# Patient Record
Sex: Female | Born: 1985
Health system: Southern US, Community
[De-identification: ages and names within clinical notes are randomized; demographics above are authoritative.]

## PROBLEM LIST (undated history)

## (undated) DIAGNOSIS — E119 Type 2 diabetes mellitus without complications: Secondary | ICD-10-CM

## (undated) DIAGNOSIS — F445 Conversion disorder with seizures or convulsions: Secondary | ICD-10-CM

## (undated) DIAGNOSIS — F419 Anxiety disorder, unspecified: Secondary | ICD-10-CM

## (undated) DIAGNOSIS — F4310 Post-traumatic stress disorder, unspecified: Secondary | ICD-10-CM

## (undated) DIAGNOSIS — O24419 Gestational diabetes mellitus in pregnancy, unspecified control: Secondary | ICD-10-CM

## (undated) DIAGNOSIS — N920 Excessive and frequent menstruation with regular cycle: Secondary | ICD-10-CM

## (undated) HISTORY — DX: Excessive and frequent menstruation with regular cycle: N92.0

## (undated) HISTORY — DX: Anxiety disorder, unspecified: F41.9

## (undated) HISTORY — DX: Conversion disorder with seizures or convulsions: F44.5

## (undated) HISTORY — DX: Type 2 diabetes mellitus without complications: E11.9

## (undated) HISTORY — DX: Post-traumatic stress disorder, unspecified: F43.10

---

## 2007-12-01 ENCOUNTER — Emergency Department (HOSPITAL_COMMUNITY): Admission: EM | Admit: 2007-12-01 | Discharge: 2007-12-01 | Payer: Self-pay | Admitting: Emergency Medicine

## 2007-12-02 ENCOUNTER — Emergency Department (HOSPITAL_COMMUNITY): Admission: EM | Admit: 2007-12-02 | Discharge: 2007-12-02 | Payer: Self-pay | Admitting: Internal Medicine

## 2008-02-07 ENCOUNTER — Emergency Department (HOSPITAL_COMMUNITY): Admission: EM | Admit: 2008-02-07 | Discharge: 2008-02-07 | Payer: Self-pay | Admitting: Emergency Medicine

## 2010-02-05 ENCOUNTER — Emergency Department (HOSPITAL_COMMUNITY)
Admission: EM | Admit: 2010-02-05 | Discharge: 2010-02-05 | Payer: Self-pay | Source: Home / Self Care | Admitting: Emergency Medicine

## 2010-02-08 LAB — URINE MICROSCOPIC-ADD ON

## 2010-02-08 LAB — URINALYSIS, ROUTINE W REFLEX MICROSCOPIC
Bilirubin Urine: NEGATIVE
Hgb urine dipstick: NEGATIVE
Ketones, ur: NEGATIVE mg/dL
Leukocytes, UA: NEGATIVE
Nitrite: POSITIVE — AB
Protein, ur: NEGATIVE mg/dL
Specific Gravity, Urine: 1.018 (ref 1.005–1.030)
Urine Glucose, Fasting: NEGATIVE mg/dL
Urobilinogen, UA: 1 mg/dL (ref 0.0–1.0)
pH: 6.5 (ref 5.0–8.0)

## 2010-02-08 LAB — POCT PREGNANCY, URINE: Preg Test, Ur: NEGATIVE

## 2010-10-26 LAB — COMPREHENSIVE METABOLIC PANEL
ALT: 16
AST: 20
Albumin: 3.9
Alkaline Phosphatase: 80
BUN: 7
CO2: 24
Calcium: 9.4
Chloride: 107
Creatinine, Ser: 0.55
GFR calc Af Amer: >60
GFR calc non Af Amer: >60
Glucose, Bld: 85
Potassium: 3.4 — ABNORMAL LOW
Sodium: 138
Total Bilirubin: 0.6
Total Protein: 6.9

## 2010-10-26 LAB — DIFFERENTIAL
Basophils Absolute: 0
Basophils Absolute: 0
Basophils Relative: 0
Basophils Relative: 0
Eosinophils Absolute: 0.1
Eosinophils Absolute: 0.1
Eosinophils Relative: 1
Eosinophils Relative: 1
Lymphocytes Relative: 17
Lymphocytes Relative: 21
Lymphs Abs: 1.5
Lymphs Abs: 2.3
Monocytes Absolute: 0.7
Monocytes Absolute: 0.7
Monocytes Relative: 7
Monocytes Relative: 8
Neutro Abs: 6.1
Neutro Abs: 7.8 — ABNORMAL HIGH
Neutrophils Relative %: 71
Neutrophils Relative %: 73

## 2010-10-26 LAB — CBC
HCT: 41.5
HCT: 41.6
Hemoglobin: 13.6
Hemoglobin: 13.8
MCHC: 32.8
MCHC: 33.1
MCV: 93.7
MCV: 94.1
Platelets: 278
Platelets: 280
RBC: 4.41
RBC: 4.44
RDW: 12.1
RDW: 12.1
WBC: 11 — ABNORMAL HIGH
WBC: 8.4

## 2010-10-26 LAB — URINALYSIS, ROUTINE W REFLEX MICROSCOPIC
Bilirubin Urine: NEGATIVE
Glucose, UA: NEGATIVE
Ketones, ur: NEGATIVE
Leukocytes, UA: NEGATIVE
Nitrite: NEGATIVE
Protein, ur: NEGATIVE
Specific Gravity, Urine: 1.02
Urobilinogen, UA: 1
pH: 6

## 2010-10-26 LAB — URINE MICROSCOPIC-ADD ON

## 2010-10-26 LAB — GC/CHLAMYDIA PROBE AMP, GENITAL
Chlamydia, DNA Probe: NEGATIVE
GC Probe Amp, Genital: NEGATIVE

## 2010-10-26 LAB — POCT PREGNANCY, URINE: Preg Test, Ur: NEGATIVE

## 2010-10-26 LAB — LIPASE, BLOOD: Lipase: 12

## 2011-01-26 ENCOUNTER — Ambulatory Visit (INDEPENDENT_AMBULATORY_CARE_PROVIDER_SITE_OTHER): Payer: BC Managed Care – PPO

## 2011-01-26 DIAGNOSIS — R1084 Generalized abdominal pain: Secondary | ICD-10-CM

## 2011-01-26 DIAGNOSIS — J019 Acute sinusitis, unspecified: Secondary | ICD-10-CM

## 2011-01-26 DIAGNOSIS — R351 Nocturia: Secondary | ICD-10-CM

## 2011-01-26 DIAGNOSIS — N390 Urinary tract infection, site not specified: Secondary | ICD-10-CM

## 2011-03-03 ENCOUNTER — Ambulatory Visit (INDEPENDENT_AMBULATORY_CARE_PROVIDER_SITE_OTHER): Payer: BC Managed Care – PPO | Admitting: Family Medicine

## 2011-03-03 DIAGNOSIS — Z658 Other specified problems related to psychosocial circumstances: Secondary | ICD-10-CM

## 2011-03-03 DIAGNOSIS — R079 Chest pain, unspecified: Secondary | ICD-10-CM

## 2011-03-03 DIAGNOSIS — F419 Anxiety disorder, unspecified: Secondary | ICD-10-CM

## 2011-03-03 LAB — POCT CBC
Granulocyte percent: 65.7 %G (ref 37–80)
HCT, POC: 41.9 % (ref 37.7–47.9)
Hemoglobin: 13.5 g/dL (ref 12.2–16.2)
Lymph, poc: 3.5 — AB (ref 0.6–3.4)
MCH, POC: 30.5 pg (ref 27–31.2)
MCHC: 32.2 g/dL (ref 31.8–35.4)
MCV: 94.7 fL (ref 80–97)
MID (cbc): 0.7 (ref 0–0.9)
MPV: 9.1 fL (ref 0–99.8)
POC Granulocyte: 8.1 — AB (ref 2–6.9)
POC LYMPH PERCENT: 28.3 %L (ref 10–50)
POC MID %: 6 %M (ref 0–12)
Platelet Count, POC: 365 10*3/uL (ref 142–424)
RBC: 4.42 M/uL (ref 4.04–5.48)
RDW, POC: 13.1 %
WBC: 12.4 10*3/uL — AB (ref 4.6–10.2)

## 2011-03-03 MED ORDER — CITALOPRAM HYDROBROMIDE 10 MG PO TABS: 10.0000 mg | ORAL_TABLET | Freq: Every day | ORAL | Status: DC

## 2011-03-03 MED ORDER — ALPRAZOLAM 0.25 MG PO TABS: 0.2500 mg | ORAL_TABLET | Freq: Every evening | ORAL | Status: AC | PRN

## 2011-03-03 NOTE — Progress Notes (Signed)
  Subjective:    Patient ID: Barbera Setters, female    DOB: Dec 25, 1985, 26 y.o.   MRN: [id number]  HPI Pt presents c/o CP that developed 3 hours ago.  Pain sharp and pounding. Worse with deep inspiration. Symptoms come and go.  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  Respiratory: Positive for shortness of breath.   Cardiovascular: Positive for palpitations.  Gastrointestinal: Negative for nausea and vomiting.  Neurological: Positive for light-headedness.      See PH9 questionnaire, No SI/HI Objective:   Physical Exam  Constitutional: She appears well-developed and well-nourished.  HENT:  Head: Normocephalic and atraumatic.  Neck: Neck supple. Thyromegaly: full, nontender, no palpable nodules.  Cardiovascular: Normal rate, regular rhythm and normal heart sounds.   Pulmonary/Chest: Effort normal and breath sounds normal.  Abdominal: Soft. Bowel sounds are normal.  Skin: Skin is warm and dry.   Tearful   Pulse oximetry- 99%, pulse-90 EKG NSR, rate 94     Assessment & Plan:   1. Chest pain  POCT CBC, TSH, EKG 12-Lead  2. Anxiety  citalopram (CELEXA) 10 MG tablet  3. Psychosocial stressors      Encouraged patient to seek supportive psychotherapy and names of counselors provided.  At the very end of our OV patient inquires into extended leave off of work. I have given patient off until Monday but if extended leave is needed I believe it's more appropriate for patient to obtain psychiatric consultation. Patient in agreement with this plan.   I will see her back in 2 weeks or patient to schedule apt with Dr. Evelene Croon.

## 2011-03-04 ENCOUNTER — Encounter: Payer: Self-pay | Admitting: Family Medicine

## 2011-03-04 DIAGNOSIS — F419 Anxiety disorder, unspecified: Secondary | ICD-10-CM | POA: Insufficient documentation

## 2011-03-04 LAB — TSH: TSH: 1.069 u[IU]/mL (ref 0.350–4.500)

## 2011-03-10 NOTE — Progress Notes (Signed)
Quick Note:  Please call patient with normal TSH. CBC discussed at OV. Thanks! KR  ______

## 2011-08-05 ENCOUNTER — Ambulatory Visit: Payer: BC Managed Care – PPO | Admitting: Family Medicine

## 2011-08-05 VITALS — BP 122/78 | HR 78 | Temp 98.7°F | Resp 17 | Ht 62.0 in | Wt 141.0 lb

## 2011-08-05 DIAGNOSIS — R252 Cramp and spasm: Secondary | ICD-10-CM

## 2011-08-05 DIAGNOSIS — N390 Urinary tract infection, site not specified: Secondary | ICD-10-CM

## 2011-08-05 LAB — POCT URINALYSIS DIPSTICK
Bilirubin, UA: NEGATIVE
Blood, UA: NEGATIVE
Glucose, UA: NEGATIVE
Ketones, UA: NEGATIVE
Nitrite, UA: POSITIVE
Protein, UA: NEGATIVE
Spec Grav, UA: 1.025
Urobilinogen, UA: 0.2
pH, UA: 6

## 2011-08-05 LAB — POCT UA - MICROSCOPIC ONLY
Casts, Ur, LPF, POC: NEGATIVE
Crystals, Ur, HPF, POC: NEGATIVE
Mucus, UA: NEGATIVE
Yeast, UA: NEGATIVE

## 2011-08-05 MED ORDER — MAGNESIUM OXIDE 400 MG PO TABS: 400.0000 mg | ORAL_TABLET | Freq: Every day | ORAL | Status: DC

## 2011-08-05 MED ORDER — NITROFURANTOIN MACROCRYSTAL 100 MG PO CAPS: 100.0000 mg | ORAL_CAPSULE | Freq: Two times a day (BID) | ORAL | Status: AC

## 2011-08-05 NOTE — Progress Notes (Signed)
@[email or web address] @   Patient ID: Jacqueline Bailey MRN: [id number], DOB: 06-Aug-1985, 26 y.o. Date of Encounter: 08/05/2011, 6:36 PM  Primary Physician: No primary provider on file.  Chief Complaint:  Chief Complaint  Patient presents with  . Urinary Tract Infection  . Back Pain  . Hand Pain    HPI: 26 y.o. year old female presents with 3 day history of dysuria, urgency, and frequency. Last UTI was January No hematuria LMP: June 19th No sick contacts, recent antibiotics, or recent travels.   No vaginal discharge, back pain, fever Recently had Ob-Gyn visit  No past medical history on file.   Home Meds: Prior to Admission medications   Medication Sig Start Date End Date Taking? Authorizing Provider  citalopram (CELEXA) 10 MG tablet Take 1 tablet (10 mg total) by mouth daily. 03/03/11 03/02/12  Dois Davenport, MD    Allergies: No Known Allergies  History   Social History  . Marital Status: Single    Spouse Name: N/A    Number of Children: N/A  . Years of Education: N/A   Occupational History  . Not on file.   Social History Main Topics  . Smoking status: Never Smoker   . Smokeless tobacco: Not on file  . Alcohol Use: Not on file  . Drug Use: Not on file  . Sexually Active: Not on file   Other Topics Concern  . Not on file   Social History Narrative  . No narrative on file     Review of Systems: Constitutional: negative for chills, fever, night sweats or weight changes Cardiovascular: negative for chest pain or palpitations Respiratory: negative for hemoptysis, wheezing, or shortness of breath Abdominal: negative for abdominal pain, nausea, vomiting or diarrhea Dermatological: negative for rash Neurologic: negative for headache   Physical Exam: Blood pressure 122/78, pulse 78, temperature 98.7 F (37.1 C), temperature source Oral, resp. rate 17, height 5\' 2"  (1.575 m), weight 141 lb (63.957 kg), last menstrual period 07/12/2011, SpO2 100.00%., Body mass index is  25.79 kg/(m^2). General: Well developed, well nourished, in no acute distress. Head: Normocephalic, atraumatic, eyes without discharge, sclera non-icteric, nares are congested. Bilateral auditory canals clear, TM's are without perforation, pearly grey with reflective cone of light bilaterally. Serous effusion bilaterally behind TM's. Maxillary sinus TTP. Oral cavity moist, dentition normal. Posterior pharynx with post nasal drip and mild erythema. No peritonsillar abscess or tonsillar exudate. Neck: Supple. No thyromegaly. Full ROM. No lymphadenopathy. Lungs: Coarse breath sounds bilaterally without Clear bilaterally to auscultation without wheezes, rales, or rhonchi. Breathing is unlabored.  Heart: RRR with S1 S2. No murmurs, rubs, or gallops appreciated. Abdomen: Soft, non-tender, non-distended with normoactive bowel sounds. No hepatosplenomegaly. No rebound/guarding. No obvious abdominal masses. McBurney's, Rovsing's, Iliopsoas, and table jar all negative. Msk:  Strength and tone normal for age. Extremities: No clubbing or cyanosis. No edema. Neuro: Alert and oriented X 3. Moves all extremities spontaneously. CNII-XII grossly in tact. Psych:  Responds to questions appropriately with a normal affect.   Labs: Results for orders placed in visit on 08/05/11  POCT URINALYSIS DIPSTICK      Component Value Range   Color, UA yellow     Clarity, UA turbid     Glucose, UA neg     Bilirubin, UA neg     Ketones, UA neg     Spec Grav, UA 1.025     Blood, UA neg     pH, UA 6.0     Protein, UA neg  Urobilinogen, UA 0.2     Nitrite, UA pos     Leukocytes, UA small (1+)    POCT UA - MICROSCOPIC ONLY      Component Value Range   WBC, Ur, HPF, POC 14-15     RBC, urine, microscopic 0-1     Bacteria, U Microscopic 3+ bacili     Mucus, UA neg     Epithelial cells, urine per micros 4-10     Crystals, Ur, HPF, POC neg     Casts, Ur, LPF, POC neg     Yeast, UA neg        ASSESSMENT AND PLAN:    26 y.o. year old female with UTI - -Mucinex -Tylenol/Motrin prn -Rest/fluids -RTC precautions -RTC 3-5 days if no improvement  Signed, Elvina Sidle, MD 08/05/2011 6:36 PM

## 2011-08-18 LAB — OB RESULTS CONSOLE ANTIBODY SCREEN: Antibody Screen: NEGATIVE

## 2011-08-18 LAB — OB RESULTS CONSOLE RUBELLA ANTIBODY, IGM: Rubella: IMMUNE

## 2011-08-18 LAB — OB RESULTS CONSOLE ABO/RH

## 2011-09-27 ENCOUNTER — Ambulatory Visit: Payer: BC Managed Care – PPO | Admitting: Family Medicine

## 2011-09-27 VITALS — BP 118/58 | HR 86 | Temp 98.5°F | Resp 16 | Ht 63.0 in | Wt 140.0 lb

## 2011-09-27 DIAGNOSIS — R112 Nausea with vomiting, unspecified: Secondary | ICD-10-CM

## 2011-09-27 DIAGNOSIS — Z3490 Encounter for supervision of normal pregnancy, unspecified, unspecified trimester: Secondary | ICD-10-CM

## 2011-09-27 LAB — POCT CBC
Granulocyte percent: 74.5 %G (ref 37–80)
HCT, POC: 44.7 % (ref 37.7–47.9)
Hemoglobin: 13.8 g/dL (ref 12.2–16.2)
Lymph, poc: 2.6 (ref 0.6–3.4)
MCH, POC: 30.1 pg (ref 27–31.2)
MCHC: 30.9 g/dL — AB (ref 31.8–35.4)
MCV: 97.7 fL — AB (ref 80–97)
MID (cbc): 1 — AB (ref 0–0.9)
MPV: 8.9 fL (ref 0–99.8)
POC Granulocyte: 10.3 — AB (ref 2–6.9)
POC LYMPH PERCENT: 18.6 %L (ref 10–50)
POC MID %: 6.9 %M (ref 0–12)
Platelet Count, POC: 339 10*3/uL (ref 142–424)
RBC: 4.58 M/uL (ref 4.04–5.48)
RDW, POC: 12.9 %
WBC: 13.8 10*3/uL — AB (ref 4.6–10.2)

## 2011-09-27 LAB — POCT UA - MICROSCOPIC ONLY
Casts, Ur, LPF, POC: NEGATIVE
Crystals, Ur, HPF, POC: NEGATIVE
Mucus, UA: POSITIVE
Yeast, UA: NEGATIVE

## 2011-09-27 LAB — POCT URINALYSIS DIPSTICK
Blood, UA: NEGATIVE
Glucose, UA: NEGATIVE
Ketones, UA: 15
Leukocytes, UA: NEGATIVE
Nitrite, UA: NEGATIVE
Protein, UA: 30
Spec Grav, UA: >=1.03
Urobilinogen, UA: 2
pH, UA: 6

## 2011-09-27 LAB — POCT URINE PREGNANCY: Preg Test, Ur: POSITIVE

## 2011-09-27 MED ORDER — ONDANSETRON HCL 4 MG PO TABS
4.0000 mg | ORAL_TABLET | Freq: Once | ORAL | Status: AC
  Administered 2011-09-27: 4 mg via ORAL

## 2011-09-27 MED ORDER — ONDANSETRON 8 MG PO TBDP: 8.0000 mg | ORAL_TABLET | Freq: Three times a day (TID) | ORAL | Status: AC | PRN

## 2011-09-27 NOTE — Patient Instructions (Addendum)
Make appt with OB gyn

## 2011-09-27 NOTE — Progress Notes (Signed)
26 yo with nausea x 1 week, and today started throwing up.  The nausea seems to occur every morning and then returns midday.  She also has migratory upper abdominal pain.  She noticed some blood in emesis this morning.  She works at a call center  Objective:  NAD Chest clear Heart regular without murmur Abdomen:  Soft, nontender without mass or HSM Results for orders placed in visit on 09/27/11  POCT UA - MICROSCOPIC ONLY      Component Value Range   WBC, Ur, HPF, POC 3-5     RBC, urine, microscopic 0-1     Bacteria, U Microscopic 1+     Mucus, UA positive     Epithelial cells, urine per micros 6-8     Crystals, Ur, HPF, POC neg     Casts, Ur, LPF, POC neg     Yeast, UA neg    POCT URINE PREGNANCY      Component Value Range   Preg Test, Ur Positive    POCT URINALYSIS DIPSTICK      Component Value Range   Color, UA Dk yellow     Clarity, UA sl cloudy     Glucose, UA neg     Bilirubin, UA small     Ketones, UA 15     Spec Grav, UA >=1.030     Blood, UA neg     pH, UA 6.0     Protein, UA 30     Urobilinogen, UA 2.0     Nitrite, UA neg     Leukocytes, UA Negative    POCT CBC      Component Value Range   WBC 13.8 (*) 4.6 - 10.2 K/uL   Lymph, poc 2.6  0.6 - 3.4   POC LYMPH PERCENT 18.6  10 - 50 %L   MID (cbc) 1.0 (*) 0 - 0.9   POC MID % 6.9  0 - 12 %M   POC Granulocyte 10.3 (*) 2 - 6.9   Granulocyte percent 74.5  37 - 80 %G   RBC 4.58  4.04 - 5.48 M/uL   Hemoglobin 13.8  12.2 - 16.2 g/dL   HCT, POC 16.1  09.6 - 47.9 %   MCV 97.7 (*) 80 - 97 fL   MCH, POC 30.1  27 - 31.2 pg   MCHC 30.9 (*) 31.8 - 35.4 g/dL   RDW, POC 04.5     Platelet Count, POC 339  142 - 424 K/uL   MPV 8.9  0 - 99.8 fL   Assessment:  Pregnant.  Plan:  Refer to OB Off today Zofran tid

## 2011-10-24 ENCOUNTER — Encounter: Payer: Self-pay | Admitting: Obstetrics and Gynecology

## 2011-12-01 LAB — OB RESULTS CONSOLE GC/CHLAMYDIA: Gonorrhea: NEGATIVE

## 2012-02-18 ENCOUNTER — Emergency Department (HOSPITAL_COMMUNITY)
Admission: EM | Admit: 2012-02-18 | Discharge: 2012-02-18 | Disposition: A | Discharge status: Home / Self Care | Payer: BC Managed Care – PPO | Attending: Emergency Medicine | Admitting: Emergency Medicine

## 2012-02-18 ENCOUNTER — Encounter (HOSPITAL_COMMUNITY): Payer: Self-pay | Admitting: Emergency Medicine

## 2012-02-18 DIAGNOSIS — Z79899 Other long term (current) drug therapy: Secondary | ICD-10-CM | POA: Insufficient documentation

## 2012-02-18 DIAGNOSIS — K047 Periapical abscess without sinus: Secondary | ICD-10-CM

## 2012-02-18 DIAGNOSIS — K089 Disorder of teeth and supporting structures, unspecified: Secondary | ICD-10-CM | POA: Insufficient documentation

## 2012-02-18 DIAGNOSIS — K044 Acute apical periodontitis of pulpal origin: Secondary | ICD-10-CM | POA: Insufficient documentation

## 2012-02-18 DIAGNOSIS — O9989 Other specified diseases and conditions complicating pregnancy, childbirth and the puerperium: Secondary | ICD-10-CM | POA: Insufficient documentation

## 2012-02-18 MED ORDER — CEPHALEXIN 500 MG PO CAPS: 500.0000 mg | ORAL_CAPSULE | Freq: Four times a day (QID) | ORAL | Status: DC

## 2012-02-18 NOTE — ED Provider Notes (Signed)
History     CSN: [id number]  Arrival date & time 02/18/12  0436   First MD Initiated Contact with Patient 02/18/12 [phone]      Chief Complaint  Patient presents with  . Dental Pain    (Consider location/radiation/quality/duration/timing/severity/associated sxs/prior treatment) HPI History per patient. Right upper molar pain x2 days. Is [redacted] weeks pregnant and taking Tylenol at home with minimal relief. No fevers. No trouble swallowing. No trouble breathing. He is sharp in quality and not radiating. Hurts to chew. No known alleviating factors. Does not have a dentist.   History reviewed. No pertinent past medical history.  History reviewed. No pertinent past surgical history.  No family history on file.  History  Substance Use Topics  . Smoking status: Never Smoker   . Smokeless tobacco: Not on file  . Alcohol Use: No    OB History    Grav Para Term Preterm Abortions TAB SAB Ect Mult Living                  Review of Systems  Constitutional: Negative for fever and chills.  HENT: Positive for dental problem. Negative for drooling, mouth sores, neck pain and neck stiffness.   Eyes: Negative for pain.  Respiratory: Negative for shortness of breath.   Cardiovascular: Negative for chest pain.  Gastrointestinal: Negative for abdominal pain.  Genitourinary: Negative for dysuria.  Musculoskeletal: Negative for back pain.  Skin: Negative for rash.  Neurological: Negative for headaches.  All other systems reviewed and are negative.    Allergies  Review of patient's allergies indicates no known allergies.  Home Medications   Current Outpatient Rx  Name  Route  Sig  Dispense  Refill  . CITALOPRAM HYDROBROMIDE 10 MG PO TABS   Oral   Take 1 tablet (10 mg total) by mouth daily.   30 tablet   1   . MAGNESIUM OXIDE 400 MG PO TABS   Oral   Take 1 tablet (400 mg total) by mouth daily.   30 tablet   6     BP 126/78  Pulse 103  Temp 97.9 F (36.6 C) (Oral)  SpO2  100%  Physical Exam  Constitutional: She is oriented to person, place, and time. She appears well-developed and well-nourished.  HENT:  Head: Normocephalic and atraumatic.  Mouth/Throat: No oropharyngeal exudate.       Right upper posterior molar caries with tenderness. No gingival swelling or fluctuance. No trismus. No facial erythema or swelling.  Eyes: EOM are normal. Pupils are equal, round, and reactive to light.  Neck: Neck supple.  Cardiovascular: Regular rhythm and intact distal pulses.   Pulmonary/Chest: Effort normal. No respiratory distress.  Abdominal:       Gravid consistent with dates  Musculoskeletal: Normal range of motion. She exhibits no edema.  Neurological: She is alert and oriented to person, place, and time.  Skin: Skin is warm and dry.    ED Course  Dental Date/Time: 02/18/2012 5:31 AM Performed by: Sunnie Nielsen Authorized by: Sunnie Nielsen Consent: Verbal consent obtained. Risks and benefits: risks, benefits and alternatives were discussed Consent given by: patient Patient understanding: patient states understanding of the procedure being performed Patient consent: the patient's understanding of the procedure matches consent given Procedure consent: procedure consent matches procedure scheduled Required items: required blood products, implants, devices, and special equipment available Patient identity confirmed: verbally with patient Time out: Immediately prior to procedure a "time out" was called to verify the correct patient, procedure, equipment, support  staff and site/side marked as required. Preparation: Patient was prepped and draped in the usual sterile fashion. Local anesthesia used: yes Local anesthetic: bupivacaine 0.5% without epinephrine Anesthetic total: 1.8 ml Patient tolerance: Patient tolerated the procedure well with no immediate complications. Comments: 27-gauge needle used to locally inject Marcaine, adequate anesthesia achieved    (including critical care time)   MDM   Acute dental pain possible infection treated with dental block as above. Plan Tylenol antibiotics and followup with dentist. Vital signs and nursing notes reviewed.        Sunnie Nielsen, MD 02/18/12 [phone]

## 2012-02-18 NOTE — ED Notes (Signed)
PT. REPORTS PERSISTENT RIGHT UPPER MOLAR PAIN FOR 2 DAYS UNRELIEVED BY OTC TYLENOL , PT. STATES [redacted] WEEKS PREGNANT.

## 2012-03-21 ENCOUNTER — Encounter: Payer: BC Managed Care – PPO | Attending: Obstetrics | Admitting: *Deleted

## 2012-03-21 VITALS — Ht 63.0 in | Wt 155.4 lb

## 2012-03-21 DIAGNOSIS — O99810 Abnormal glucose complicating pregnancy: Secondary | ICD-10-CM | POA: Insufficient documentation

## 2012-03-21 DIAGNOSIS — Z713 Dietary counseling and surveillance: Secondary | ICD-10-CM | POA: Insufficient documentation

## 2012-03-22 ENCOUNTER — Encounter: Payer: Self-pay | Admitting: *Deleted

## 2012-03-22 NOTE — Progress Notes (Signed)
  Patient was seen on 03/21/12 for Gestational Diabetes self-management class at the [facility]. The following learning objectives were met by the patient during this course:   States the definition of Gestational Diabetes  States why dietary management is important in controlling blood glucose  Describes the effects each nutrient has on blood glucose levels  Demonstrates ability to create a balanced meal plan  Demonstrates carbohydrate counting   States when to check blood glucose levels  Demonstrates proper blood glucose monitoring techniques  States the effect of stress and exercise on blood glucose levels  States the importance of limiting caffeine and abstaining from alcohol and smoking  Blood glucose monitor given:  One Touch Ultra Mini Self Monitoring Kit Lot # [id number] X Exp: 04/2013 Blood glucose reading: 78 mg/dl  Patient instructed to monitor glucose levels: FBS: 60 - <90 2 hour: <120  *Patient received handouts:  Nutrition Diabetes and Pregnancy  Carbohydrate Counting List  Patient will be seen for follow-up as needed.

## 2012-03-22 NOTE — Patient Instructions (Signed)
Goals:  Check glucose levels per MD as instructed  Follow Gestational Diabetes Diet as instructed  Call for follow-up as needed    

## 2012-04-13 LAB — OB RESULTS CONSOLE GBS: GBS: POSITIVE

## 2012-05-14 ENCOUNTER — Inpatient Hospital Stay (HOSPITAL_COMMUNITY)
Admission: RE | Admit: 2012-05-14 | Discharge: 2012-05-18 | DRG: 650 | Disposition: A | Discharge status: Home / Self Care | Payer: BC Managed Care – PPO | Source: Ambulatory Visit | Attending: Obstetrics & Gynecology | Admitting: Obstetrics & Gynecology

## 2012-05-14 ENCOUNTER — Encounter (HOSPITAL_COMMUNITY): Payer: Self-pay

## 2012-05-14 ENCOUNTER — Other Ambulatory Visit: Payer: Self-pay | Admitting: Obstetrics

## 2012-05-14 DIAGNOSIS — Z22330 Carrier of Group B streptococcus: Secondary | ICD-10-CM

## 2012-05-14 DIAGNOSIS — O99814 Abnormal glucose complicating childbirth: Principal | ICD-10-CM | POA: Diagnosis present

## 2012-05-14 DIAGNOSIS — O9903 Anemia complicating the puerperium: Secondary | ICD-10-CM | POA: Diagnosis not present

## 2012-05-14 DIAGNOSIS — O139 Gestational [pregnancy-induced] hypertension without significant proteinuria, unspecified trimester: Secondary | ICD-10-CM | POA: Diagnosis present

## 2012-05-14 DIAGNOSIS — D62 Acute posthemorrhagic anemia: Secondary | ICD-10-CM | POA: Diagnosis not present

## 2012-05-14 DIAGNOSIS — O99892 Other specified diseases and conditions complicating childbirth: Secondary | ICD-10-CM | POA: Diagnosis present

## 2012-05-14 HISTORY — DX: Gestational diabetes mellitus in pregnancy, unspecified control: O24.419

## 2012-05-14 LAB — CBC
Hemoglobin: 12 g/dL (ref 12.0–15.0)
MCHC: 34.3 g/dL (ref 30.0–36.0)
RDW: 14.4 % (ref 11.5–15.5)
WBC: 13.7 10*3/uL — ABNORMAL HIGH (ref 4.0–10.5)

## 2012-05-14 LAB — TYPE AND SCREEN
ABO/RH(D): O POS
Antibody Screen: NEGATIVE

## 2012-05-14 LAB — GLUCOSE, CAPILLARY: Glucose-Capillary: 78 mg/dL (ref 70–99)

## 2012-05-14 MED ORDER — ACETAMINOPHEN 325 MG PO TABS
650.0000 mg | ORAL_TABLET | ORAL | Status: DC | PRN
  Administered 2012-05-15: 650 mg via ORAL
  Filled 2012-05-14: qty 2

## 2012-05-14 MED ORDER — BUTORPHANOL TARTRATE 1 MG/ML IJ SOLN
1.0000 mg | INTRAMUSCULAR | Status: DC | PRN
  Administered 2012-05-15 (×3): 1 mg via INTRAVENOUS
  Filled 2012-05-14 (×3): qty 1

## 2012-05-14 MED ORDER — OXYCODONE-ACETAMINOPHEN 5-325 MG PO TABS: 1.0000 | ORAL_TABLET | ORAL | Status: DC | PRN

## 2012-05-14 MED ORDER — OXYTOCIN 40 UNITS IN LACTATED RINGERS INFUSION - SIMPLE MED
1.0000 m[IU]/min | INTRAVENOUS | Status: DC
  Administered 2012-05-15: 2 m[IU]/min via INTRAVENOUS
  Filled 2012-05-14: qty 1000

## 2012-05-14 MED ORDER — LACTATED RINGERS IV SOLN
INTRAVENOUS | Status: DC
  Administered 2012-05-14 – 2012-05-16 (×8): via INTRAVENOUS

## 2012-05-14 MED ORDER — CITRIC ACID-SODIUM CITRATE 334-500 MG/5ML PO SOLN
30.0000 mL | ORAL | Status: DC | PRN
  Administered 2012-05-16: 30 mL via ORAL
  Filled 2012-05-14: qty 15

## 2012-05-14 MED ORDER — ZOLPIDEM TARTRATE 5 MG PO TABS
5.0000 mg | ORAL_TABLET | Freq: Every evening | ORAL | Status: DC | PRN
  Administered 2012-05-14: 5 mg via ORAL
  Filled 2012-05-14: qty 1

## 2012-05-14 MED ORDER — IBUPROFEN 600 MG PO TABS: 600.0000 mg | ORAL_TABLET | Freq: Four times a day (QID) | ORAL | Status: DC | PRN

## 2012-05-14 MED ORDER — TERBUTALINE SULFATE 1 MG/ML IJ SOLN: 0.2500 mg | Freq: Once | INTRAMUSCULAR | Status: AC | PRN

## 2012-05-14 MED ORDER — PENICILLIN G POTASSIUM 5000000 UNITS IJ SOLR
5.0000 10*6.[IU] | Freq: Once | INTRAVENOUS | Status: DC
  Filled 2012-05-14: qty 5

## 2012-05-14 MED ORDER — MISOPROSTOL 25 MCG QUARTER TABLET
25.0000 ug | ORAL_TABLET | ORAL | Status: DC | PRN
  Administered 2012-05-14 – 2012-05-15 (×2): 25 ug via VAGINAL
  Filled 2012-05-14 (×2): qty 0.25

## 2012-05-14 MED ORDER — OXYTOCIN BOLUS FROM INFUSION: 500.0000 mL | INTRAVENOUS | Status: DC

## 2012-05-14 MED ORDER — ONDANSETRON HCL 4 MG/2ML IJ SOLN
4.0000 mg | Freq: Four times a day (QID) | INTRAMUSCULAR | Status: DC | PRN
  Administered 2012-05-15 – 2012-05-16 (×5): 4 mg via INTRAVENOUS
  Filled 2012-05-14 (×5): qty 2

## 2012-05-14 MED ORDER — LACTATED RINGERS IV SOLN
500.0000 mL | INTRAVENOUS | Status: DC | PRN
  Administered 2012-05-14: 500 mL via INTRAVENOUS

## 2012-05-14 MED ORDER — OXYTOCIN 40 UNITS IN LACTATED RINGERS INFUSION - SIMPLE MED
62.5000 mL/h | INTRAVENOUS | Status: DC
  Filled 2012-05-14: qty 1000

## 2012-05-14 MED ORDER — LIDOCAINE HCL (PF) 1 % IJ SOLN: 30.0000 mL | INTRAMUSCULAR | Status: DC | PRN

## 2012-05-14 MED ORDER — PENICILLIN G POTASSIUM 5000000 UNITS IJ SOLR
2.5000 10*6.[IU] | INTRAVENOUS | Status: DC
  Filled 2012-05-14 (×5): qty 2.5

## 2012-05-14 NOTE — H&P (Signed)
Jacqueline Bailey is a 27 y.o. G1P0 at [redacted]w[redacted]d presenting for IOL. Pt notes onset contractions 2am today. Pt has been seen several times in the office and in [facility]U over the past week for concerns of labor and/or AROM. Seen again today for r/o labor. Pt notes good FM, no LOF today but presence of consistent uterine contractions. Continues to take routine BS checks, highest fasting today at 96.   PNCare at [facility] since 9 wks - GDM. DS at 170, failed 3 hr. Since then has done well with diet, only 17# total wt gain, nl growth by u/s, nl BS by qid testing and no need for meds. Plan IOL at 40 wks, ripening given unripe cvx. Pt understands risks of c/s w/ IOL and risks of GDM, shoulder dystocia, need for freq BS checks in labor - prolonged latent labor. Plan IOL, pt aware risks - GBS pos. PCN in labor - unable to swallow pills - stress related dizziness, tachycardia, orthostasis and pre-syncope during preg- improved on limited work hours and home bedrest. No cardiac w/u done. - fetal growth, last 3/27: 60%   Prenatal Transfer Tool  Maternal Diabetes: Yes:  Diabetes Type:  Diet controlled Genetic Screening: Normal Maternal Ultrasounds/Referrals: Normal Fetal Ultrasounds or other Referrals:  None Maternal Substance Abuse:  No Significant Maternal Medications:  None Significant Maternal Lab Results: None     OB History   Grav Para Term Preterm Abortions TAB SAB Ect Mult Living   1              Past Medical History  Diagnosis Date  . Diabetes mellitus without complication   . Gestational diabetes     diet controlled   History reviewed. No pertinent past surgical history. Family History: family history is not on file. Social History:  reports that she quit smoking about a year ago. She has never used smokeless tobacco. She reports that she does not drink alcohol or use illicit drugs.  Review of Systems - Negative except freq contractions   Dilation: Fingertip Effacement (%):  Thick Station: -3 Exam by:: l.poore, rn Blood pressure 133/88, pulse 111, temperature 97.1 F (36.2 C), temperature source Oral, resp. rate 20, height 5\' 2"  (1.575 m), weight 73.483 kg (162 lb), last menstrual period 08/12/2011.  Physical Exam: done in office 05/14/12 Gen: well appearing, no distress CV: RRR Pulm: CTAB Back: no CVAT Abd: gravid, NT, no RUQ pain LE: no edema, equal bilaterally, non-tender  NST 4/21: 11 pm Toco: rare/ irritibility FH: baseline 150's, accelerations present, no deceleratons, 10 beat variability  Prenatal labs: ABO, Rh: --/--/O POS (04/21 1940) Antibody: NEG (04/21 1940) Rubella:  immune RPR: Nonreactive (07/25 0000)  HBsAg: Negative (07/25 0000)  HIV: Non-reactive (07/25 0000)  GBS: Positive (03/21 0000)  1 hr Glucola 170   Genetic screening nl quad Anatomy US nl anatomy   Assessment/Plan: 27 y.o. G1P0 at [redacted]w[redacted]d - GDM. Plan IOL at 40 wks, ripening given unripe cvx. Pt understands risks of c/s w/ IOL and risks of GDM, shoulder dystocia, need for freq BS checks in labor - prolonged latent labor. Plan IOL, pt aware risks - GBS pos. PCN in labor - unable to swallow pills   Quintus Premo A. 05/14/2012, 11:00 PM

## 2012-05-15 ENCOUNTER — Inpatient Hospital Stay (HOSPITAL_COMMUNITY): Admission: AD | Admit: 2012-05-15 | Payer: Self-pay | Source: Ambulatory Visit | Admitting: Obstetrics

## 2012-05-15 ENCOUNTER — Encounter (HOSPITAL_COMMUNITY): Payer: Self-pay

## 2012-05-15 LAB — GLUCOSE, CAPILLARY
Glucose-Capillary: 118 mg/dL — ABNORMAL HIGH (ref 70–99)
Glucose-Capillary: 80 mg/dL (ref 70–99)
Glucose-Capillary: 81 mg/dL (ref 70–99)
Glucose-Capillary: 94 mg/dL (ref 70–99)
Glucose-Capillary: 97 mg/dL (ref 70–99)

## 2012-05-15 LAB — RPR: RPR Ser Ql: NONREACTIVE

## 2012-05-15 MED ORDER — EPHEDRINE 5 MG/ML INJ: 10.0000 mg | INTRAVENOUS | Status: DC | PRN

## 2012-05-15 MED ORDER — PHENYLEPHRINE 40 MCG/ML (10ML) SYRINGE FOR IV PUSH (FOR BLOOD PRESSURE SUPPORT): 80.0000 ug | PREFILLED_SYRINGE | INTRAVENOUS | Status: DC | PRN

## 2012-05-15 MED ORDER — PENICILLIN G POTASSIUM 5000000 UNITS IJ SOLR
5.0000 10*6.[IU] | Freq: Once | INTRAVENOUS | Status: AC
  Administered 2012-05-15: 5 10*6.[IU] via INTRAVENOUS
  Filled 2012-05-15: qty 5

## 2012-05-15 MED ORDER — PHENYLEPHRINE 40 MCG/ML (10ML) SYRINGE FOR IV PUSH (FOR BLOOD PRESSURE SUPPORT)
80.0000 ug | PREFILLED_SYRINGE | INTRAVENOUS | Status: AC | PRN
  Administered 2012-05-16 (×4): 80 ug via INTRAVENOUS
  Filled 2012-05-15: qty 5

## 2012-05-15 MED ORDER — FAMOTIDINE 20 MG PO TABS
20.0000 mg | ORAL_TABLET | Freq: Once | ORAL | Status: AC
  Administered 2012-05-15: 20 mg via ORAL
  Filled 2012-05-15: qty 1

## 2012-05-15 MED ORDER — NALBUPHINE SYRINGE 5 MG/0.5 ML
INJECTION | INTRAMUSCULAR | Status: AC
  Filled 2012-05-15: qty 1

## 2012-05-15 MED ORDER — OXYTOCIN 40 UNITS IN LACTATED RINGERS INFUSION - SIMPLE MED
1.0000 m[IU]/min | INTRAVENOUS | Status: DC
  Administered 2012-05-16: 14 m[IU]/min via INTRAVENOUS
  Administered 2012-05-16: 22 m[IU]/min via INTRAVENOUS
  Administered 2012-05-16: 36 m[IU]/min via INTRAVENOUS

## 2012-05-15 MED ORDER — PENICILLIN G POTASSIUM 5000000 UNITS IJ SOLR
2.5000 10*6.[IU] | INTRAVENOUS | Status: DC
  Administered 2012-05-15 – 2012-05-16 (×7): 2.5 10*6.[IU] via INTRAVENOUS
  Filled 2012-05-15 (×11): qty 2.5

## 2012-05-15 MED ORDER — NALBUPHINE SYRINGE 5 MG/0.5 ML
10.0000 mg | INJECTION | Freq: Once | INTRAMUSCULAR | Status: AC
  Administered 2012-05-15: 10 mg via INTRAVENOUS
  Filled 2012-05-15: qty 0.5

## 2012-05-15 MED ORDER — DIPHENHYDRAMINE HCL 50 MG/ML IJ SOLN: 12.5000 mg | INTRAMUSCULAR | Status: DC | PRN

## 2012-05-15 MED ORDER — LIDOCAINE HCL (PF) 1 % IJ SOLN
INTRAMUSCULAR | Status: DC | PRN
  Administered 2012-05-15 (×4): 4 mL

## 2012-05-15 MED ORDER — LACTATED RINGERS IV SOLN
500.0000 mL | Freq: Once | INTRAVENOUS | Status: AC
  Administered 2012-05-15: 500 mL via INTRAVENOUS

## 2012-05-15 MED ORDER — FENTANYL 2.5 MCG/ML BUPIVACAINE 1/10 % EPIDURAL INFUSION (WH - ANES)
14.0000 mL/h | INTRAMUSCULAR | Status: DC | PRN
  Administered 2012-05-15 – 2012-05-16 (×4): 14 mL/h via EPIDURAL
  Filled 2012-05-15 (×4): qty 125

## 2012-05-15 MED ORDER — EPHEDRINE 5 MG/ML INJ
10.0000 mg | INTRAVENOUS | Status: DC | PRN
  Filled 2012-05-15: qty 4

## 2012-05-15 NOTE — Anesthesia Preprocedure Evaluation (Signed)
Anesthesia Evaluation  Patient identified by MRN, date of birth, ID band Patient awake    Reviewed: Allergy & Precautions, H&P , NPO status , Patient's Chart, lab work & pertinent test results, reviewed documented beta blocker date and time   History of Anesthesia Complications Negative for: history of anesthetic complications  Airway Mallampati: II TM Distance: >3 FB Neck ROM: full    Dental  (+) Teeth Intact   Pulmonary former smoker (quit 1 year ago),  breath sounds clear to auscultation        Cardiovascular negative cardio ROS  Rhythm:regular Rate:Normal     Neuro/Psych negative neurological ROS  negative psych ROS   GI/Hepatic negative GI ROS, Neg liver ROS,   Endo/Other  diabetes (diet-controlled), Gestational  Renal/GU negative Renal ROS     Musculoskeletal   Abdominal   Peds  Hematology negative hematology ROS (+)   Anesthesia Other Findings   Reproductive/Obstetrics (+) Pregnancy                           Anesthesia Physical Anesthesia Plan  ASA: II  Anesthesia Plan: Epidural   Post-op Pain Management:    Induction:   Airway Management Planned:   Additional Equipment:   Intra-op Plan:   Post-operative Plan:   Informed Consent: I have reviewed the patients History and Physical, chart, labs and discussed the procedure including the risks, benefits and alternatives for the proposed anesthesia with the patient or authorized representative who has indicated his/her understanding and acceptance.     Plan Discussed with:   Anesthesia Plan Comments:         Anesthesia Quick Evaluation

## 2012-05-15 NOTE — Progress Notes (Signed)
S: Doing well, no complaints, pain well controlled with epidural  O: BP 125/78  Pulse 95  Temp(Src) 98.5 F (36.9 C) (Oral)  Resp 20  Ht 5\' 2"  (1.575 m)  Wt 73.483 kg (162 lb)  BMI 29.62 kg/m2  SpO2 100%  LMP 08/12/2011   FHT:  FHR: 140s bpm, variability: moderate,  accelerations:  Present,  decelerations:  Absent UC:   irregular, every 2-7 minutes SVE:   Dilation: 4 Effacement (%): 90 Station: -2;-3 Exam by:: dr Ernestina Penna   A / P:  26 y.o.  Obstetric History   G1   P0   T0   P0   A0   TAB0   SAB0   E0   M0   L0    at 40'w0d Protracted latent phase. Cont to titrate pitocin to effective contractions  Fetal Wellbeing:  Category I Pain Control:  Epidural  Anticipated MOD:  NSVD.  D/w pt and family continued patience as pt is just now starting to enter active labor.  Jacqueline Bailey A. 05/15/2012 9:23 PM

## 2012-05-15 NOTE — Anesthesia Procedure Notes (Signed)
Epidural Patient location during procedure: OB Start time: 05/15/2012 12:07 PM  Staffing Performed by: anesthesiologist   Preanesthetic Checklist Completed: patient identified, site marked, surgical consent, pre-op evaluation, timeout performed, IV checked, risks and benefits discussed and monitors and equipment checked  Epidural Patient position: sitting Prep: site prepped and draped and DuraPrep Patient monitoring: continuous pulse ox and blood pressure Approach: midline Injection technique: LOR air  Needle:  Needle type: Tuohy  Needle gauge: 17 G Needle length: 9 cm and 9 Needle insertion depth: 4 cm Catheter type: closed end flexible Catheter size: 19 Gauge Catheter at skin depth: 9 cm Test dose: negative  Assessment Events: blood not aspirated, injection not painful, no injection resistance, negative IV test and no paresthesia  Additional Notes Discussed risk of headache, infection, bleeding, nerve injury and failed or incomplete block.  Patient voices understanding and wishes to proceed.  Epidural placed easily on first attempt.  No paresthesia.  Patient tolerated procedure well with no apparent complications.  Jasmine December, MDReason for block:procedure for pain

## 2012-05-15 NOTE — Progress Notes (Signed)
S: Doing well, no complaints, pain well controlled with epidural  O: BP 130/80  Pulse 92  Temp(Src) 98.4 F (36.9 C) (Oral)  Resp 18  Ht 5\' 2"  (1.575 m)  Wt 73.483 kg (162 lb)  BMI 29.62 kg/m2  SpO2 100%  LMP 08/12/2011   FHT:  Reactive, 10 beat accels, no decels UC:   irregular, every 4-7 minutes, pitocinon SVE:   Dilation: 2 Effacement (%): 80 Station: -3 Exam by:: Dr. Ernestina Penna AROM clear, IUPC placed to help titrate pitocin   A / P:  26 y.o.  Obstetric History   G1   P0   T0   P0   A0   TAB0   SAB0   E0   M0   L0    at 40'0  Induction of labor due to gestational diabetes,  progressing well on pitocin  Fetal Wellbeing:  Category I Pain Control:  Epidural  Anticipated MOD:  NSVD Cont to increase pitocin  Jacqueline Bailey A. 05/15/2012, 3:37 PM

## 2012-05-15 NOTE — Progress Notes (Signed)
S: Doing well, no complaints, pain well controlled with epidural  O: BP 111/80  Pulse 88  Temp(Src) 98.4 F (36.9 C) (Oral)  Resp 18  Ht 5\' 2"  (1.575 m)  Wt 73.483 kg (162 lb)  BMI 29.62 kg/m2  SpO2 100%  LMP 08/12/2011   FHT: reactive w/o decels, 10 beat variability UC:   irregular, every 4-6 minutes, pit at 6 munits/ min SVE:   Dilation: 1 Effacement (%): 80 Station: -3 Exam by:: Dr. Ernestina Penna   A / P:  26 y.o.  Obstetric History   G1   P0   T0   P0   A0   TAB0   SAB0   E0   M0   L0    at [redacted]w[redacted]d  Induction of labor due to gestational diabetes,  progressing well on pitocin Slow progress, will plan AROM when head better applied to cvx  Fetal Wellbeing:  Category I Pain Control:  Epidural  Anticipated MOD:  NSVD, need to get pt into active labor  Jacqueline Buys A. 05/15/2012, 2:05 PM

## 2012-05-15 NOTE — Progress Notes (Signed)
Jacqueline Bailey is a 27 y.o. G1P0 at [redacted]w[redacted]d, IOL for GDM.   Objective: BP 132/82  Pulse 89  Temp(Src) 98.2 F (36.8 C) (Oral)  Resp 18  Ht 5\' 2"  (1.575 m)  Wt 162 lb (73.483 kg)  BMI 29.62 kg/m2  SpO2 100%  LMP 08/12/2011    FHT:  FHR: 135 bpm, variability: moderate,  accelerations:  Present,  decelerations:  Absent UC:   irregular, every 2-5 minutes on piticin SVE:   Dilation: 2 Effacement (%): 50 Station: -2 Exam by:: Erline Hau RNC  Labs: Lab Results  Component Value Date   WBC 13.7* 05/14/2012   HGB 12.0 05/14/2012   HCT 35.0* 05/14/2012   MCV 86.4 05/14/2012   PLT 251 05/14/2012    Assessment / Plan: 27 yo, G1 at 39.4 wks, GDM (A1), gest.HTN, well controlled.  Tried Stadol (several doses) and Nubain without much relief. Dysfunction contraction pattern, station high and head not well applied to cervix. Early for epidural but will proceed due to pt discomfort and failed medical options.  FHT category I Mode of delivery guarded for SVD, but continue to follow progress.   [name] R 05/15/2012, 11:57 AM

## 2012-05-16 ENCOUNTER — Encounter (HOSPITAL_COMMUNITY): Payer: Self-pay | Admitting: Anesthesiology

## 2012-05-16 ENCOUNTER — Inpatient Hospital Stay (HOSPITAL_COMMUNITY): Payer: BC Managed Care – PPO | Admitting: Anesthesiology

## 2012-05-16 ENCOUNTER — Encounter (HOSPITAL_COMMUNITY)
Admission: RE | Disposition: A | Discharge status: Home / Self Care | Payer: Self-pay | Source: Ambulatory Visit | Attending: Obstetrics & Gynecology

## 2012-05-16 ENCOUNTER — Encounter (HOSPITAL_COMMUNITY): Payer: Self-pay

## 2012-05-16 LAB — GLUCOSE, CAPILLARY: Glucose-Capillary: 102 mg/dL — ABNORMAL HIGH (ref 70–99)

## 2012-05-16 SURGERY — Surgical Case
Anesthesia: Epidural | Site: Abdomen | Wound class: Clean Contaminated

## 2012-05-16 MED ORDER — SENNOSIDES-DOCUSATE SODIUM 8.6-50 MG PO TABS
2.0000 | ORAL_TABLET | Freq: Every day | ORAL | Status: DC
  Administered 2012-05-16 – 2012-05-17 (×2): 2 via ORAL

## 2012-05-16 MED ORDER — MORPHINE SULFATE 0.5 MG/ML IJ SOLN
INTRAMUSCULAR | Status: AC
  Filled 2012-05-16: qty 10

## 2012-05-16 MED ORDER — FENTANYL CITRATE 0.05 MG/ML IJ SOLN
INTRAMUSCULAR | Status: AC
  Filled 2012-05-16: qty 5

## 2012-05-16 MED ORDER — DIPHENHYDRAMINE HCL 25 MG PO CAPS
25.0000 mg | ORAL_CAPSULE | ORAL | Status: DC | PRN
  Filled 2012-05-16: qty 1

## 2012-05-16 MED ORDER — SIMETHICONE 80 MG PO CHEW
80.0000 mg | CHEWABLE_TABLET | Freq: Three times a day (TID) | ORAL | Status: DC
  Administered 2012-05-16 – 2012-05-18 (×5): 80 mg via ORAL

## 2012-05-16 MED ORDER — LANOLIN HYDROUS EX OINT: 1.0000 "application " | TOPICAL_OINTMENT | CUTANEOUS | Status: DC | PRN

## 2012-05-16 MED ORDER — OXYCODONE-ACETAMINOPHEN 5-325 MG PO TABS
1.0000 | ORAL_TABLET | ORAL | Status: DC | PRN
  Administered 2012-05-17: 1 via ORAL
  Administered 2012-05-17 (×2): 2 via ORAL
  Administered 2012-05-17: 1 via ORAL
  Administered 2012-05-18 (×2): 2 via ORAL
  Filled 2012-05-16: qty 2
  Filled 2012-05-16: qty 1
  Filled 2012-05-16: qty 2
  Filled 2012-05-16: qty 1
  Filled 2012-05-16 (×2): qty 2

## 2012-05-16 MED ORDER — HYDROMORPHONE HCL PF 1 MG/ML IJ SOLN: 0.2500 mg | INTRAMUSCULAR | Status: DC | PRN

## 2012-05-16 MED ORDER — KETOROLAC TROMETHAMINE 60 MG/2ML IM SOLN
INTRAMUSCULAR | Status: AC
  Administered 2012-05-16: 60 mg via INTRAMUSCULAR
  Filled 2012-05-16: qty 2

## 2012-05-16 MED ORDER — OXYTOCIN 10 UNIT/ML IJ SOLN
INTRAMUSCULAR | Status: AC
  Filled 2012-05-16: qty 4

## 2012-05-16 MED ORDER — SODIUM CHLORIDE 0.9 % IJ SOLN: 3.0000 mL | INTRAMUSCULAR | Status: DC | PRN

## 2012-05-16 MED ORDER — OXYTOCIN 40 UNITS IN LACTATED RINGERS INFUSION - SIMPLE MED: 62.5000 mL/h | INTRAVENOUS | Status: AC

## 2012-05-16 MED ORDER — OXYTOCIN 10 UNIT/ML IJ SOLN
40.0000 [IU] | INTRAVENOUS | Status: DC | PRN
  Administered 2012-05-16: 40 [IU] via INTRAVENOUS

## 2012-05-16 MED ORDER — ONDANSETRON HCL 4 MG/2ML IJ SOLN
INTRAMUSCULAR | Status: AC
  Filled 2012-05-16: qty 2

## 2012-05-16 MED ORDER — KETOROLAC TROMETHAMINE 30 MG/ML IJ SOLN: 30.0000 mg | Freq: Four times a day (QID) | INTRAMUSCULAR | Status: DC | PRN

## 2012-05-16 MED ORDER — LACTATED RINGERS IV SOLN
INTRAVENOUS | Status: DC
  Administered 2012-05-17: via INTRAVENOUS

## 2012-05-16 MED ORDER — DIPHENHYDRAMINE HCL 50 MG/ML IJ SOLN: 25.0000 mg | INTRAMUSCULAR | Status: DC | PRN

## 2012-05-16 MED ORDER — SODIUM BICARBONATE 8.4 % IV SOLN
INTRAVENOUS | Status: DC | PRN
  Administered 2012-05-16: 5 mL via EPIDURAL

## 2012-05-16 MED ORDER — ONDANSETRON HCL 4 MG/2ML IJ SOLN: 4.0000 mg | INTRAMUSCULAR | Status: DC | PRN

## 2012-05-16 MED ORDER — PHENYLEPHRINE 40 MCG/ML (10ML) SYRINGE FOR IV PUSH (FOR BLOOD PRESSURE SUPPORT)
PREFILLED_SYRINGE | INTRAVENOUS | Status: AC
  Filled 2012-05-16: qty 10

## 2012-05-16 MED ORDER — 0.9 % SODIUM CHLORIDE (POUR BTL) OPTIME
TOPICAL | Status: DC | PRN
  Administered 2012-05-16: 1000 mL

## 2012-05-16 MED ORDER — DIPHENHYDRAMINE HCL 25 MG PO CAPS: 25.0000 mg | ORAL_CAPSULE | Freq: Four times a day (QID) | ORAL | Status: DC | PRN

## 2012-05-16 MED ORDER — PRENATAL MULTIVITAMIN CH
1.0000 | ORAL_TABLET | Freq: Every day | ORAL | Status: DC
  Administered 2012-05-17 – 2012-05-18 (×2): 1 via ORAL
  Filled 2012-05-16 (×3): qty 1

## 2012-05-16 MED ORDER — TETANUS-DIPHTH-ACELL PERTUSSIS 5-2.5-18.5 LF-MCG/0.5 IM SUSP: 0.5000 mL | Freq: Once | INTRAMUSCULAR | Status: DC

## 2012-05-16 MED ORDER — METOCLOPRAMIDE HCL 5 MG/ML IJ SOLN: 10.0000 mg | Freq: Three times a day (TID) | INTRAMUSCULAR | Status: DC | PRN

## 2012-05-16 MED ORDER — MORPHINE SULFATE (PF) 0.5 MG/ML IJ SOLN
INTRAMUSCULAR | Status: DC | PRN
  Administered 2012-05-16: 3 mg via EPIDURAL

## 2012-05-16 MED ORDER — FENTANYL CITRATE 0.05 MG/ML IJ SOLN
INTRAMUSCULAR | Status: DC | PRN
  Administered 2012-05-16: 100 ug via INTRAVENOUS

## 2012-05-16 MED ORDER — ZOLPIDEM TARTRATE 5 MG PO TABS: 5.0000 mg | ORAL_TABLET | Freq: Every evening | ORAL | Status: DC | PRN

## 2012-05-16 MED ORDER — NALBUPHINE HCL 10 MG/ML IJ SOLN
5.0000 mg | INTRAMUSCULAR | Status: DC | PRN
  Filled 2012-05-16: qty 1

## 2012-05-16 MED ORDER — NALOXONE HCL 0.4 MG/ML IJ SOLN: 0.4000 mg | INTRAMUSCULAR | Status: DC | PRN

## 2012-05-16 MED ORDER — ONDANSETRON HCL 4 MG/2ML IJ SOLN
INTRAMUSCULAR | Status: DC | PRN
  Administered 2012-05-16: 4 mg via INTRAVENOUS

## 2012-05-16 MED ORDER — DIBUCAINE 1 % RE OINT: 1.0000 "application " | TOPICAL_OINTMENT | RECTAL | Status: DC | PRN

## 2012-05-16 MED ORDER — NALOXONE HCL 1 MG/ML IJ SOLN
1.0000 ug/kg/h | INTRAVENOUS | Status: DC | PRN
  Filled 2012-05-16: qty 2

## 2012-05-16 MED ORDER — IBUPROFEN 600 MG PO TABS
600.0000 mg | ORAL_TABLET | Freq: Four times a day (QID) | ORAL | Status: DC
  Administered 2012-05-16 – 2012-05-18 (×7): 600 mg via ORAL
  Filled 2012-05-16 (×8): qty 1

## 2012-05-16 MED ORDER — FENTANYL CITRATE 0.05 MG/ML IJ SOLN
INTRAMUSCULAR | Status: AC
  Filled 2012-05-16: qty 2

## 2012-05-16 MED ORDER — SCOPOLAMINE 1 MG/3DAYS TD PT72
MEDICATED_PATCH | TRANSDERMAL | Status: AC
  Administered 2012-05-16: 1.5 mg via TRANSDERMAL
  Filled 2012-05-16: qty 1

## 2012-05-16 MED ORDER — SCOPOLAMINE 1 MG/3DAYS TD PT72: 1.0000 | MEDICATED_PATCH | Freq: Once | TRANSDERMAL | Status: DC

## 2012-05-16 MED ORDER — SIMETHICONE 80 MG PO CHEW
80.0000 mg | CHEWABLE_TABLET | ORAL | Status: DC | PRN
  Administered 2012-05-17: 80 mg via ORAL

## 2012-05-16 MED ORDER — CEFAZOLIN SODIUM-DEXTROSE 2-3 GM-% IV SOLR
2.0000 g | Freq: Once | INTRAVENOUS | Status: AC
  Administered 2012-05-16: 2 g via INTRAVENOUS
  Filled 2012-05-16: qty 50

## 2012-05-16 MED ORDER — SODIUM BICARBONATE 8.4 % IV SOLN
INTRAVENOUS | Status: AC
  Filled 2012-05-16: qty 50

## 2012-05-16 MED ORDER — MENTHOL 3 MG MT LOZG: 1.0000 | LOZENGE | OROMUCOSAL | Status: DC | PRN

## 2012-05-16 MED ORDER — LIDOCAINE-EPINEPHRINE (PF) 2 %-1:200000 IJ SOLN
INTRAMUSCULAR | Status: AC
  Filled 2012-05-16: qty 20

## 2012-05-16 MED ORDER — DIPHENHYDRAMINE HCL 50 MG/ML IJ SOLN: 12.5000 mg | INTRAMUSCULAR | Status: DC | PRN

## 2012-05-16 MED ORDER — ACETAMINOPHEN 10 MG/ML IV SOLN
1000.0000 mg | Freq: Four times a day (QID) | INTRAVENOUS | Status: DC | PRN
  Filled 2012-05-16: qty 100

## 2012-05-16 MED ORDER — WITCH HAZEL-GLYCERIN EX PADS: 1.0000 "application " | MEDICATED_PAD | CUTANEOUS | Status: DC | PRN

## 2012-05-16 MED ORDER — ONDANSETRON HCL 4 MG/2ML IJ SOLN: 4.0000 mg | Freq: Three times a day (TID) | INTRAMUSCULAR | Status: DC | PRN

## 2012-05-16 MED ORDER — ONDANSETRON HCL 4 MG PO TABS: 4.0000 mg | ORAL_TABLET | ORAL | Status: DC | PRN

## 2012-05-16 MED ORDER — KETOROLAC TROMETHAMINE 60 MG/2ML IM SOLN: 60.0000 mg | Freq: Once | INTRAMUSCULAR | Status: AC | PRN

## 2012-05-16 MED ORDER — MEPERIDINE HCL 25 MG/ML IJ SOLN: 6.2500 mg | INTRAMUSCULAR | Status: DC | PRN

## 2012-05-16 SURGICAL SUPPLY — 36 items
CLOTH BEACON ORANGE TIMEOUT ST (SAFETY) IMPLANT
CONTAINER PREFILL 10% NBF 15ML (MISCELLANEOUS) IMPLANT
DRAPE LG THREE QUARTER DISP (DRAPES) ×2 IMPLANT
DRSG OPSITE POSTOP 4X10 (GAUZE/BANDAGES/DRESSINGS) ×2 IMPLANT
DURAPREP 26ML APPLICATOR (WOUND CARE) ×2 IMPLANT
ELECT REM PT RETURN 9FT ADLT (ELECTROSURGICAL) ×2
ELECTRODE REM PT RTRN 9FT ADLT (ELECTROSURGICAL) ×1 IMPLANT
EXTRACTOR VACUUM KIWI (MISCELLANEOUS) IMPLANT
EXTRACTOR VACUUM M CUP 4 TUBE (SUCTIONS) IMPLANT
GLOVE BIO SURGEON STRL SZ 6.5 (GLOVE) ×2 IMPLANT
GLOVE BIOGEL PI IND STRL 7.0 (GLOVE) ×1 IMPLANT
GLOVE BIOGEL PI INDICATOR 7.0 (GLOVE) ×1
GOWN STRL REIN XL XLG (GOWN DISPOSABLE) ×4 IMPLANT
KIT ABG SYR 3ML LUER SLIP (SYRINGE) IMPLANT
NEEDLE HYPO 25X5/8 SAFETYGLIDE (NEEDLE) IMPLANT
NS IRRIG 1000ML POUR BTL (IV SOLUTION) ×2 IMPLANT
PACK C SECTION WH (CUSTOM PROCEDURE TRAY) ×2 IMPLANT
PAD OB MATERNITY 4.3X12.25 (PERSONAL CARE ITEMS) ×2 IMPLANT
SLEEVE SCD COMPRESS KNEE MED (MISCELLANEOUS) IMPLANT
SPONGE LAP 18X18 X RAY DECT (DISPOSABLE) ×2 IMPLANT
STAPLER VISISTAT 35W (STAPLE) IMPLANT
STRIP CLOSURE SKIN 1/2X4 (GAUZE/BANDAGES/DRESSINGS) ×4 IMPLANT
SUT MON AB 4-0 PS1 27 (SUTURE) IMPLANT
SUT PLAIN 0 NONE (SUTURE) IMPLANT
SUT PLAIN 2 0 (SUTURE) ×1
SUT PLAIN 2 0 XLH (SUTURE) IMPLANT
SUT PLAIN ABS 2-0 CT1 27XMFL (SUTURE) ×1 IMPLANT
SUT VIC AB 0 CT1 36 (SUTURE) ×4 IMPLANT
SUT VIC AB 0 CTX 36 (SUTURE) ×4
SUT VIC AB 0 CTX36XBRD ANBCTRL (SUTURE) ×4 IMPLANT
SUT VIC AB 2-0 CT1 27 (SUTURE) ×1
SUT VIC AB 2-0 CT1 TAPERPNT 27 (SUTURE) ×1 IMPLANT
SUT VIC AB 4-0 PS2 27 (SUTURE) ×2 IMPLANT
TOWEL OR 17X24 6PK STRL BLUE (TOWEL DISPOSABLE) ×6 IMPLANT
TRAY FOLEY CATH 14FR (SET/KITS/TRAYS/PACK) IMPLANT
WATER STERILE IRR 1000ML POUR (IV SOLUTION) ×2 IMPLANT

## 2012-05-16 NOTE — Transfer of Care (Signed)
Immediate Anesthesia Transfer of Care Note  Patient: Jacqueline Bailey  Procedure(s) Performed: Procedure(s): CESAREAN SECTION (N/A)  Patient Location: PACU  Anesthesia Type:Epidural  Level of Consciousness: awake, alert , oriented and patient cooperative  Airway & Oxygen Therapy: Patient Spontanous Breathing  Post-op Assessment: Report given to PACU RN and Post -op Vital signs reviewed and stable  Post vital signs: Reviewed and stable  Complications: No apparent anesthesia complications

## 2012-05-16 NOTE — Progress Notes (Signed)
S: Doing well, no complaints, pain well controlled with epidural  O: BP 119/91  Pulse 114  Temp(Src) 98.4 F (36.9 C) (Oral)  Resp 18  Ht 5\' 2"  (1.575 m)  Wt 73.483 kg (162 lb)  BMI 29.62 kg/m2  SpO2 100%  LMP 08/12/2011   FHT:  FHR: 140s bpm, variability: moderate,  accelerations:  Present,  decelerations:  Absent UC:   regular, every 4 minutes, pit on, MVU's 150's SVE:   Dilation: 6 Effacement (%): 100 Station: 0 Exam by:: Dr. Ernestina Penna   A / P:  26 y.o.  Obstetric History   G1   P0   T0   P0   A0   TAB0   SAB0   E0   M0   L0    at [redacted]w[redacted]d Arrest in active phase of labor  Fetal Wellbeing:  Category I Pain Control:  Epidural  Anticipated MOD:  Given no change past 6 cm over the past 6 hrs, increased caput and molding, no descent past the 0 station, will move to [facility]. R/B d/w pt.   Khristian Seals A. 05/16/2012, 11:01 AM

## 2012-05-16 NOTE — Brief Op Note (Signed)
05/14/2012 - 05/16/2012  12:35 PM  PATIENT:  Jacqueline Bailey  27 y.o. female  PRE-OPERATIVE DIAGNOSIS:  cesarean section, arrest of dilation/ descent, GDM  POST-OPERATIVE DIAGNOSIS:  cesarean section, arrest of dilation/ descent, GDM   PROCEDURE:  Procedure(s): CESAREAN SECTION (N/A), LTCS w/ 2 layer closure  SURGEON:  Surgeon(s) and Role:    * Emrah Ariola A. Ernestina Penna, MD - Primary  PHYSICIAN ASSISTANT:   ASSISTANTS: none   ANESTHESIA:   epidural  EBL:  Total I/O In: 3900 [I.V.:3900] Out: 1100 [Urine:200; Blood:900]  BLOOD ADMINISTERED:none  DRAINS: Urinary Catheter (Foley)   LOCAL MEDICATIONS USED:  NONE  SPECIMEN:  Source of Specimen:  placenta  DISPOSITION OF SPECIMEN:  L&D  COUNTS:  YES  TOURNIQUET:  * No tourniquets in log *  DICTATION: .Note written in EPIC  PLAN OF CARE: Admit to inpatient   PATIENT DISPOSITION:  PACU - hemodynamically stable.   Delay start of Pharmacological VTE agent (>24hrs) due to surgical blood loss or risk of bleeding: yes

## 2012-05-16 NOTE — Anesthesia Postprocedure Evaluation (Signed)
  Anesthesia Post-op Note  Patient: Jacqueline Bailey  Procedure(s) Performed: Procedure(s): CESAREAN SECTION (N/A)  Patient is awake, responsive, moving her legs, and has signs of resolution of her numbness. Pain and nausea are reasonably well controlled. Vital signs are stable and clinically acceptable. Oxygen saturation is clinically acceptable. There are no apparent anesthetic complications at this time. Patient is ready for discharge.

## 2012-05-16 NOTE — Anesthesia Postprocedure Evaluation (Signed)
  Anesthesia Post-op Note  Patient: Jacqueline Bailey  Procedure(s) Performed: Procedure(s): CESAREAN SECTION (N/A)  Patient Location: Mother/Baby  Anesthesia Type:Epidural  Level of Consciousness: awake  Airway and Oxygen Therapy: Patient Spontanous Breathing  Post-op Pain: mild  Post-op Assessment: Patient's Cardiovascular Status Stable and Respiratory Function Stable  Post-op Vital Signs: stable  Complications: No apparent anesthesia complications

## 2012-05-16 NOTE — Progress Notes (Signed)
S: Doing well, no complaints, pain well controlled with epidural  O: BP 126/71  Pulse 96  Temp(Src) 98.9 F (37.2 C) (Oral)  Resp 18  Ht 5\' 2"  (1.575 m)  Wt 73.483 kg (162 lb)  BMI 29.62 kg/m2  SpO2 100%  LMP 08/12/2011   FHT:  FHR: 140s bpm, variability: moderate,  accelerations:  Present,  decelerations:  Absent UC:   irregular, every 3-6 minutes SVE:   Dilation: 6 Effacement (%): 100 Station: 0 Exam by:: Dr. Ernestina Penna   A / P:  26 y.o.  Obstetric History   G1   P0   T0   P0   A0   TAB0   SAB0   E0   M0   L0    at [redacted]w[redacted]d possible arrest of labor, has had a slow course, through the night slowly progressed from 4 to 5 to 6 cm, now has been 6 cm for 4 hours. will replace IUPC and see if titriting pitocin can affect cervical change, if not, plan PCS  Fetal Wellbeing:  Category I Pain Control:  Epidural  Anticipated MOD:  unclear  Jacqueline Bailey A. 05/16/2012, 9:21 AM

## 2012-05-16 NOTE — Progress Notes (Addendum)
S: Doing well, no complaints, pain well controlled with epidural  O: BP 117/77  Pulse 126  Temp(Src) 98.5 F (36.9 C) (Oral)  Resp 20  Ht 5\' 2"  (1.575 m)  Wt 73.483 kg (162 lb)  BMI 29.62 kg/m2  SpO2 100%  LMP 08/12/2011   FHT:  FHR: 140s bpm, variability: moderate,  accelerations:  Present,  decelerations:  Absent UC:   irregular, every 2-6 minutes, dysfunctional pattern SVE:   Dilation: 6 Effacement (%): 100 Station: +1 Exam by:: ansah-mensah, rnc   A / P:  26 y.o.  Obstetric History   G1   P0   T0   P0   A0   TAB0   SAB0   E0   M0   L0    at [redacted]w[redacted]d Induction of labor due to gestational diabetes,  progressing well on pitocin  Fetal Wellbeing:  Category I Pain Control:  Epidural  Anticipated MOD:  NSVD and very slow progress but dysfunctional labor pattern, cont to allow expectant management as pt making slow change  Nedra Mcinnis A. 05/16/2012, 6:05 AM  Prolonged bed rest w/ epidural, will place SCD's now. LE NT, trace edema.

## 2012-05-16 NOTE — Op Note (Signed)
PATIENT:  Jacqueline Bailey  27 y.o. female  PRE-OPERATIVE DIAGNOSIS:  cesarean section, arrest of dilation/ descent, GDM  POST-OPERATIVE DIAGNOSIS:  cesarean section, arrest of dilation/ descent, GDM   PROCEDURE:  Procedure(s): CESAREAN SECTION (N/A), LTCS w/ 2 layer closure  SURGEON:  Surgeon(s) and Role:    * Sic[place] Zaragoza A. Ernestina Penna, MD - Primary  PHYSICIAN ASSISTANT:   ASSISTANTS: none   ANESTHESIA:   epidural  EBL:  Total I/O In: 3900 [I.V.:3900] Out: 1100 [Urine:200; Blood:900]  BLOOD ADMINISTERED:none  DRAINS: Urinary Catheter (Foley)   LOCAL MEDICATIONS USED:  NONE  SPECIMEN:  Source of Specimen:  placenta  DISPOSITION OF SPECIMEN:  L&D  COUNTS:  YES  TOURNIQUET:  * No tourniquets in log *  DICTATION: .Note written in EPIC  PLAN OF CARE: Admit to inpatient   PATIENT DISPOSITION:  PACU - hemodynamically stable.   Delay start of Pharmacological VTE agent (>24hrs) due to surgical blood loss or risk of bleeding: yes    Findings:  @BABYSEXEBC @ infant,  APGAR (1 MIN): 8   APGAR (5 MINS): 9   APGAR (10 MINS):    Nl uterus, tubes, ovaries, nl placenta, clear amniotic fluid, persistent OP  EBL: 900 cc Antibiotics:  2g Ancef  Complications: none  Indications: This is a 27 y.o. year-old, G1  At [redacted]w[redacted]d admitted for IOL due to GDM. 3 day IOL, cytotec x 2, pit x >24 hrs, AROM x 20 hrs, cvx w/ no progression past 6 cm for 6 hrs, decision for c/s. Marland Kitchen Risks benefits and alternatives of the procedure were discussed with the patient who agreed to proceed  Procedure:  After informed consent was obtained the patient was taken to the operating room where epidural anesthesia was found to be adequate.  She was prepped and draped in the normal sterile fashion in dorsal supine position with a leftward tilt.  A foley catheter was inserted sterilely into the bladder prior the labor room. Gloves were changes and attention was turned to the patient's abdomen. A Pfannenstiel skin incision  was made 2 cm above the pubic symphysis in the midline with the scalpel.  Dissection was carried down with the Bovie cautery until the fascia was reached. The fascia was incised in the midline. The incision was extended laterally with the Mayo scissors. The inferior aspect of the fascial incision was grasped with the Coker clamps, elevated up and the underlying rectus muscles were dissected off sharply. The superior aspect of the fascial incision was grasped with the Coker clamps elevated up and the underlying rectus muscles were dissected off sharply.  The peritoneum was entered bluntly. The peritoneal incision was extended superiorly and inferiorly with good visualization of the bladder. The bladder blade was inserted, palpation was done to assess the fetal position and the location of the uterine vessels. Given the molding was wedged into the pelvis the labor nurse placed a hand into the vagina and elevated the fetal head.The lower segment of the uterus was incised sharply with the scalpel and extended superiorly and laterally with the bandage scissors. The infant also was grasped brought to the incision,  rotated and the infant was delivered with fundal pressure. The nose and mouth were bulb suctioned. The cord was clamped and cut. The infant was handed off to the waiting pediatrician. The placenta was expressed. The uterus was exteriorized. The uterus was cleared of all clots and debris. An inferior extension was noted and closed primarily. The uterine incision was repaired with 0 Vicryl in a  running locked fashion.  A second layer of the same suture was used in an imbricating fashion to obtain excellent hemostasis. An additional figure of 8 was placed in the L apex.  The uterus was then returned to the abdomen, the gutters were cleared of all clots and debris. The uterine incision was reinspected and found to be hemostatic. The peritoneum was grasped and closed with 2-0 Vicryl in a running fashion. The cut  muscle edges and the underside of the fascia were inspected and found to be hemostatic. The fascia was closed with 0 Vicryl in two halves. The subcutaneous tissue was irrigated. Scarpa's layer was closed with a 2-0 plain gut suture. The skin was closed with a 4-0 Monocryl in a single layer. The patient tolerated the procedure well. Sponge lap and needle counts were correct x3 and patient was taken to the recovery room in a stable condition.  Astin Sayre A. 05/16/2012 12:42 PM

## 2012-05-17 LAB — CBC
MCV: 86.6 fL (ref 78.0–100.0)
Platelets: 179 10*3/uL (ref 150–400)
RBC: 2.68 MIL/uL — ABNORMAL LOW (ref 3.87–5.11)
RDW: 14.7 % (ref 11.5–15.5)
WBC: 20.7 10*3/uL — ABNORMAL HIGH (ref 4.0–10.5)

## 2012-05-17 LAB — GLUCOSE, CAPILLARY: Glucose-Capillary: 104 mg/dL — ABNORMAL HIGH (ref 70–99)

## 2012-05-17 MED ORDER — DOCUSATE SODIUM 100 MG PO CAPS
100.0000 mg | ORAL_CAPSULE | Freq: Every day | ORAL | Status: DC
  Administered 2012-05-18: 100 mg via ORAL
  Filled 2012-05-17: qty 1

## 2012-05-17 MED ORDER — POLYSACCHARIDE IRON COMPLEX 150 MG PO CAPS
150.0000 mg | ORAL_CAPSULE | Freq: Every day | ORAL | Status: DC
  Administered 2012-05-18: 150 mg via ORAL
  Filled 2012-05-17 (×3): qty 1

## 2012-05-17 NOTE — Progress Notes (Signed)
POSTOPERATIVE DAY # 1 S/P cesarean section   S:         Reports feeling well             Tolerating po intake / no  nausea / no  vomiting / no flatus / no BM             Bleeding is light, moderate             Pain controlled with motrin and percocet             Up ad lib / ambulatory/ voiding QS  Newborn breast feeding     O:  VS: BP 108/68  Pulse 99  Temp(Src) 98 F (36.7 C) (Oral)  Resp 16  Ht 5\' 2"  (1.575 m)  Wt 73.483 kg (162 lb)  BMI 29.62 kg/m2  SpO2 99%  LMP 08/12/2011   LABS:  Recent Labs  05/14/12 1940 05/17/12 0600  WBC 13.7* 20.7*  HGB 12.0 7.6*  PLT 251 179                           I&O: Intake/Output     04/23 0701 - 04/24 0700 04/24 0701 - 04/25 0700   I.V. (mL/kg) 4150 (56.5)    Other 720    Total Intake(mL/kg) 4870 (66.3)    Urine (mL/kg/hr) 1350 (0.8) 200 (0.9)   Emesis/NG output 500 (0.3)    Blood 900 (0.5)    Total Output 2750 200   Net +2120 -200                     Physical Exam:             Alert and Oriented X3  Lungs: Clear and unlabored  Heart: regular rate and rhythm / no mumurs  Abdomen: soft, non-tender, non-distended, active BS             Fundus: firm, non-tender, U-1             Dressing intact honeycomb             Perineum: no edema  Lochia: light  Extremities: trace edema, no calf pain or tenderness, neg Homans  A:        POD # 1 S/P cesarean section            ABL anemia  P:        Routine postoperative care              Start iron and colace tomorrow   Marlinda Mike CNM, MSN 05/17/2012, 10:09 AM

## 2012-05-18 ENCOUNTER — Encounter (HOSPITAL_COMMUNITY): Payer: Self-pay | Admitting: Obstetrics

## 2012-05-18 ENCOUNTER — Encounter (HOSPITAL_COMMUNITY)
Admission: RE | Admit: 2012-05-18 | Discharge: 2012-05-18 | Disposition: A | Discharge status: Home / Self Care | Payer: BC Managed Care – PPO | Source: Ambulatory Visit | Attending: Obstetrics & Gynecology | Admitting: Obstetrics & Gynecology

## 2012-05-18 DIAGNOSIS — O923 Agalactia: Secondary | ICD-10-CM | POA: Insufficient documentation

## 2012-05-18 MED ORDER — OXYCODONE-ACETAMINOPHEN 5-325 MG PO TABS: 1.0000 | ORAL_TABLET | ORAL | Status: DC | PRN

## 2012-05-18 MED ORDER — DSS 100 MG PO CAPS: 100.0000 mg | ORAL_CAPSULE | Freq: Every day | ORAL | Status: DC

## 2012-05-18 MED ORDER — POLYSACCHARIDE IRON COMPLEX 150 MG PO CAPS: 150.0000 mg | ORAL_CAPSULE | Freq: Every day | ORAL | Status: DC

## 2012-05-18 MED ORDER — BUTORPHANOL TARTRATE 1 MG/ML IJ SOLN
INTRAMUSCULAR | Status: AC
  Filled 2012-05-18: qty 2

## 2012-05-18 MED ORDER — IBUPROFEN 600 MG PO TABS: 600.0000 mg | ORAL_TABLET | Freq: Four times a day (QID) | ORAL | Status: DC

## 2012-05-18 NOTE — Discharge Summary (Signed)
POSTOPERATIVE DISCHARGE SUMMARY:  Patient ID: Jacqueline Bailey MRN: [id number] DOB/AGE: 05-05-1985 27 y.o.  Admit date: 05/14/2012 Admission Diagnoses: 40 weeks / GDMA1 / induction of labor   Discharge date:  05/19/2012 Discharge Diagnoses: POD 2 s/p Cesarean section / ABL anemia - stable  Prenatal history: G1P1001   EDC : 05/15/2012, by Other Basis  Prenatal care at [facility]  Primary provider : Ernestina Penna Prenatal course complicated by GDMA1 / + GBS carrier  Prenatal Labs: ABO, Rh: O (07/25 0000) positive Antibody: NEG (04/21 1940) Rubella: Immune (07/25 0000)  RPR: NON REACTIVE (04/21 1940)  HBsAg: Negative (07/25 0000)  HIV: Non-reactive (07/25 0000)  GBS: Positive (03/21 0000)  1 hr Glucola : ABN  Medical / Surgical History :  Past medical history:  Past Medical History  Diagnosis Date  . Diabetes mellitus without complication   . Gestational diabetes     diet controlled    Past surgical history: History reviewed. No pertinent past surgical history.  Family History: History reviewed. No pertinent family history.  Social History:  reports that she quit smoking about a year ago. She has never used smokeless tobacco. She reports that she does not drink alcohol or use illicit drugs.   Allergies: Review of patient's allergies indicates no known allergies.    Current Medications at time of admission:  Prenatal vitamin daily   Intrapartum Course: admit prodromal ctx pattern for induction of labor / pitocin low dose protocol / epidural for pain management / GBS ABX prophylaxis in labor / AROM / arrest of active labor at 6 cm   Procedures: Cesarean section delivery of female newborn by Dr Ernestina Penna  See operative report for further details APGAR (1 MIN): 8   APGAR (5 MINS): 9    Baby weight: 7-7  Postoperative / postpartum course: acute blood loss anemia - hemodynamically stable / discharge POD 2  Physical Exam:   VSS: Temp:  [97.8 F (36.6  C)-98.4 F (36.9 C)] 97.8 F (36.6 C) (04/25 0530) Pulse Rate:  [98-107] 98 (04/25 0530) Resp:  [18-20] 20 (04/25 0530) BP: (110-126)/(78-85) 126/82 mmHg (04/25 0530) SpO2:  [100 %] 100 % (04/24 1348)  LABS:  Recent Labs  05/17/12 0600  WBC 20.7*  HGB 7.6*  PLT 179    General: NAD ambulatory - dressed in room POD wanting d/c home Heart:RR Lungs:clear Abdomen:soft active BS / non-distended / uterus firm and non-tender Extremities: trace edema Dressing: intact honeycomb ( removal POD 5) Incision:  approximated with subcuticular / no erythema / no ecchymosis / no drainage  Discharge Instructions:  Discharged Condition: stable Activity: pelvic rest and postoperative restrictions x 2 weeks Diet: routine Medications: see below   Medication List    TAKE these medications       acetaminophen 500 MG tablet  Commonly known as:  TYLENOL  Take 1,000 mg by mouth every 6 (six) hours as needed. pain     DSS 100 MG Caps  Take 100 mg by mouth daily.     ibuprofen 600 MG tablet  Commonly known as:  ADVIL,MOTRIN  Take 1 tablet (600 mg total) by mouth every 6 (six) hours.     iron polysaccharides 150 MG capsule  Commonly known as:  NIFEREX  Take 1 capsule (150 mg total) by mouth daily.     oxyCODONE-acetaminophen 5-325 MG per tablet  Commonly known as:  PERCOCET/ROXICET  Take 1-2 tablets by mouth every 4 (four) hours as needed.     prenatal multivitamin  Tabs  Take 1 tablet by mouth daily at 12 noon.       Wound Care: dressing OFF POD / keep clean and dry  / signs to call reviewed Postpartum Instructions: [facility] discharge booklet - instructions reviewed Discharge to: Home  Follow up : [facility] in 6 weeks for routine postpartum visit                     2 hr GTT with A1c at 6-12 weeks postpartum                      Signed: Marlinda Mike CNM, MSN 05/18/2012, 9:39 AM

## 2012-05-18 NOTE — Progress Notes (Signed)
POSTOPERATIVE DAY # 2 S/P cesarean section   S:         Reports feeling well - wants to go home             Tolerating po intake / no nausea / no vomiting / + flatus / no BM             Bleeding is spotting             Pain controlled with motrin and percocet             Up ad lib / ambulatory - no dizziness or SOB / voiding QS  Newborn breast feeding     O:  VS: BP 126/82  Pulse 98  Temp(Src) 97.8 F (36.6 C) (Oral)  Resp 20  Ht 5\' 2"  (1.575 m)  Wt 73.483 kg (162 lb)  BMI 29.62 kg/m2  SpO2 100%  LMP 08/12/2011   LABS:  Recent Labs  05/17/12 0600  WBC 20.7*  HGB 7.6*  PLT 179                           I&O: Intake/Output     04/24 0701 - 04/25 0700 04/25 0701 - 04/26 0700   I.V. (mL/kg)     Other     Total Intake(mL/kg)     Urine (mL/kg/hr) 600 (0.3)    Emesis/NG output     Blood     Total Output 600     Net -600                       Physical Exam:             Alert and Oriented X3  Lungs: Clear and unlabored  Heart: regular rate and rhythm / no mumurs  Abdomen: soft, non-tender, non-distended Active BS             Fundus: firm, non-tender, U-1             Dressing intacte              Incision:  approximated with subcuticular / no erythema / no ecchymosis / no drainage  Perineum: no edema  Lochia: light  Extremities: trace edema, no calf pain or tenderness, negative Homans  A:        POD # 2 S/P cesarean section            ABL anemia - hemodynamically stable  P:        Routine postoperative care              [facility] booklet - instructions reviewed             Iron twice daily x 2 weeks then daily until 6 week visit - recheck cbc and ferritin at 6 weeks     Marlinda Mike CNM, MSN 05/18/2012, 9:34 AM

## 2012-06-19 ENCOUNTER — Encounter (HOSPITAL_COMMUNITY)
Admission: RE | Admit: 2012-06-19 | Discharge: 2012-06-19 | Disposition: A | Discharge status: Home / Self Care | Payer: BC Managed Care – PPO | Source: Ambulatory Visit | Attending: Obstetrics & Gynecology | Admitting: Obstetrics & Gynecology

## 2012-06-19 DIAGNOSIS — O923 Agalactia: Secondary | ICD-10-CM | POA: Insufficient documentation

## 2012-07-20 ENCOUNTER — Encounter (HOSPITAL_COMMUNITY)
Admission: RE | Admit: 2012-07-20 | Discharge: 2012-07-20 | Disposition: A | Discharge status: Home / Self Care | Payer: BC Managed Care – PPO | Source: Ambulatory Visit | Attending: Obstetrics & Gynecology | Admitting: Obstetrics & Gynecology

## 2012-07-20 DIAGNOSIS — O923 Agalactia: Secondary | ICD-10-CM | POA: Insufficient documentation

## 2012-08-19 ENCOUNTER — Encounter (HOSPITAL_COMMUNITY)
Admission: RE | Admit: 2012-08-19 | Discharge: 2012-08-19 | Disposition: A | Discharge status: Home / Self Care | Payer: BC Managed Care – PPO | Source: Ambulatory Visit | Attending: Obstetrics & Gynecology | Admitting: Obstetrics & Gynecology

## 2012-08-19 DIAGNOSIS — O923 Agalactia: Secondary | ICD-10-CM | POA: Insufficient documentation

## 2012-09-19 ENCOUNTER — Encounter (HOSPITAL_COMMUNITY)
Admission: RE | Admit: 2012-09-19 | Discharge: 2012-09-19 | Disposition: A | Discharge status: Home / Self Care | Payer: BC Managed Care – PPO | Source: Ambulatory Visit | Attending: Obstetrics & Gynecology | Admitting: Obstetrics & Gynecology

## 2012-09-19 DIAGNOSIS — O923 Agalactia: Secondary | ICD-10-CM | POA: Insufficient documentation

## 2012-10-20 ENCOUNTER — Encounter (HOSPITAL_COMMUNITY)
Admission: RE | Admit: 2012-10-20 | Discharge: 2012-10-20 | Disposition: A | Discharge status: Home / Self Care | Payer: BC Managed Care – PPO | Source: Ambulatory Visit | Attending: Obstetrics & Gynecology | Admitting: Obstetrics & Gynecology

## 2012-10-20 DIAGNOSIS — O923 Agalactia: Secondary | ICD-10-CM | POA: Insufficient documentation

## 2012-11-20 ENCOUNTER — Encounter (HOSPITAL_COMMUNITY)
Admission: RE | Admit: 2012-11-20 | Discharge: 2012-11-20 | Disposition: A | Discharge status: Home / Self Care | Payer: BC Managed Care – PPO | Source: Ambulatory Visit | Attending: Obstetrics & Gynecology | Admitting: Obstetrics & Gynecology

## 2012-11-20 DIAGNOSIS — O923 Agalactia: Secondary | ICD-10-CM | POA: Insufficient documentation

## 2012-12-21 ENCOUNTER — Encounter (HOSPITAL_COMMUNITY)
Admission: RE | Admit: 2012-12-21 | Discharge: 2012-12-21 | Disposition: A | Discharge status: Home / Self Care | Payer: BC Managed Care – PPO | Source: Ambulatory Visit | Attending: Obstetrics & Gynecology | Admitting: Obstetrics & Gynecology

## 2012-12-21 DIAGNOSIS — O923 Agalactia: Secondary | ICD-10-CM | POA: Insufficient documentation

## 2013-01-16 ENCOUNTER — Ambulatory Visit: Payer: BC Managed Care – PPO

## 2013-01-16 ENCOUNTER — Ambulatory Visit (INDEPENDENT_AMBULATORY_CARE_PROVIDER_SITE_OTHER): Payer: BC Managed Care – PPO | Admitting: Family Medicine

## 2013-01-16 VITALS — BP 132/84 | HR 103 | Temp 99.3°F | Resp 16 | Ht 62.0 in | Wt 148.2 lb

## 2013-01-16 DIAGNOSIS — R05 Cough: Secondary | ICD-10-CM

## 2013-01-16 DIAGNOSIS — R509 Fever, unspecified: Secondary | ICD-10-CM

## 2013-01-16 DIAGNOSIS — R059 Cough, unspecified: Secondary | ICD-10-CM

## 2013-01-16 LAB — POCT INFLUENZA A/B
Influenza A, POC: NEGATIVE
Influenza B, POC: NEGATIVE

## 2013-01-16 MED ORDER — AZITHROMYCIN 250 MG PO TABS: ORAL_TABLET | ORAL | Status: DC

## 2013-01-16 MED ORDER — HYDROCODONE-HOMATROPINE 5-1.5 MG/5ML PO SYRP: 5.0000 mL | ORAL_SOLUTION | Freq: Three times a day (TID) | ORAL | Status: DC | PRN

## 2013-01-16 NOTE — Progress Notes (Addendum)
This chart was scribed for Elvina Sidle, MD by Joaquin Music, ED Scribe. This patient was seen in room Room/bed 1 and the patient's care was started at 8:21 AM. Subjective:    Patient ID: Jacqueline Bailey, female    DOB: 1985/06/09, 27 y.o.   MRN: [id number] Chief Complaint  Patient presents with   Cough    productive ; symptoms since monday    Generalized Body Aches   Headache   Nausea   Emesis   chest tightness    burns with inhailing    HPI Shaneen Reeser is a 27 y.o. female who presents to the [facility] complaining of ongoing cough, HA, myalgias, nausea, emesis and chest tightness with associated chills that began 3 days ago. Pt states she has been progressively worsening and states she is now horse. Pt states her last episode of emesis was this morning. Pt states she had limited amount of sleep last night due to current symptoms. She states she has tried OTC medications but denies having any relief. Pt states her co-worker was recently sick with the flu. She states she has had her flu shot this season. Pt states she is no longer taking prenatal vitamins and other medications. Pt denies breastfeeding any longer. She states her daughter is 66 months old.   Pt states 5 days ago she began having a burning sensation in her throat/chest.  Pt states she works full-time at [employer] center.  Review of Systems  Constitutional: Positive for fever and chills.  HENT: Positive for rhinorrhea.   Respiratory: Positive for cough.   Gastrointestinal: Positive for nausea and vomiting.  Musculoskeletal: Positive for myalgias.  Neurological: Positive for headaches.   Objective:   Physical Exam  Nursing note and vitals reviewed. Constitutional: She is oriented to person, place, and time. She appears well-developed and well-nourished. No distress.  HENT:  Head: Normocephalic and atraumatic.  Right Ear: External ear normal.  Left Ear: External ear normal.  Eyes: Conjunctivae are  normal. Pupils are equal, round, and reactive to light.  Neck: Normal range of motion. Neck supple.  Cardiovascular: Normal rate, regular rhythm and normal heart sounds.   No murmur heard. Pulmonary/Chest: Effort normal. She has no wheezes. She has rales.  Rales in bilateral lungs.  Abdominal: Bowel sounds are normal. She exhibits no mass. There is no rebound and no guarding.  Musculoskeletal: Normal range of motion. She exhibits no tenderness.  Lymphadenopathy:    She has no cervical adenopathy.  Neurological: She is alert and oriented to person, place, and time. She has normal reflexes.  Skin: Skin is warm and dry. She is not diaphoretic.  Psychiatric: She has a normal mood and affect. Her behavior is normal.   Results for orders placed in visit on 01/16/13  POCT INFLUENZA A/B      Result Value Range   Influenza A, POC Negative     Influenza B, POC Negative     [facility] reading (PRIMARY) by  Dr. Milus Glazier  CXR negative.    Triage Vitals:BP 132/84   Pulse 103   Temp(Src) 99.3 F (37.4 C) (Oral)   Resp 16   Ht 5\' 2"  (1.575 m)   Wt 148 lb 3.2 oz (67.223 kg)   BMI 27.10 kg/m2   SpO2 100% Assessment & Plan:   1. Cough   2. Fever     Cough - Plan: DG Chest 2 View, POCT Influenza A/B  Fever - Plan: DG Chest 2 View, POCT Influenza A/B Z pak and  hydromet Signed, Elvina Sidle, MD

## 2013-01-21 ENCOUNTER — Encounter (HOSPITAL_COMMUNITY)
Admission: RE | Admit: 2013-01-21 | Discharge: 2013-01-21 | Disposition: A | Discharge status: Home / Self Care | Payer: BC Managed Care – PPO | Source: Ambulatory Visit | Attending: Obstetrics & Gynecology | Admitting: Obstetrics & Gynecology

## 2013-01-21 DIAGNOSIS — O923 Agalactia: Secondary | ICD-10-CM | POA: Insufficient documentation

## 2013-02-21 ENCOUNTER — Encounter (HOSPITAL_COMMUNITY)
Admission: RE | Admit: 2013-02-21 | Discharge: 2013-02-21 | Disposition: A | Discharge status: Home / Self Care | Payer: BC Managed Care – PPO | Source: Ambulatory Visit | Attending: Obstetrics & Gynecology | Admitting: Obstetrics & Gynecology

## 2013-02-21 DIAGNOSIS — O923 Agalactia: Secondary | ICD-10-CM | POA: Insufficient documentation

## 2013-03-24 ENCOUNTER — Encounter (HOSPITAL_COMMUNITY)
Admission: RE | Admit: 2013-03-24 | Discharge: 2013-03-24 | Disposition: A | Discharge status: Home / Self Care | Payer: BC Managed Care – PPO | Source: Ambulatory Visit | Attending: Obstetrics & Gynecology | Admitting: Obstetrics & Gynecology

## 2013-03-24 DIAGNOSIS — O923 Agalactia: Secondary | ICD-10-CM | POA: Insufficient documentation

## 2013-04-24 ENCOUNTER — Encounter (HOSPITAL_COMMUNITY)
Admission: RE | Admit: 2013-04-24 | Discharge: 2013-04-24 | Disposition: A | Discharge status: Home / Self Care | Payer: BC Managed Care – PPO | Source: Ambulatory Visit | Attending: Obstetrics & Gynecology | Admitting: Obstetrics & Gynecology

## 2013-04-24 DIAGNOSIS — O923 Agalactia: Secondary | ICD-10-CM | POA: Insufficient documentation

## 2013-05-24 ENCOUNTER — Encounter (HOSPITAL_COMMUNITY)
Admission: RE | Admit: 2013-05-24 | Discharge: 2013-05-24 | Disposition: A | Discharge status: Home / Self Care | Payer: BC Managed Care – PPO | Source: Ambulatory Visit | Attending: Obstetrics & Gynecology | Admitting: Obstetrics & Gynecology

## 2013-05-24 DIAGNOSIS — O923 Agalactia: Secondary | ICD-10-CM | POA: Insufficient documentation

## 2013-06-24 ENCOUNTER — Encounter (HOSPITAL_COMMUNITY)
Admission: RE | Admit: 2013-06-24 | Discharge: 2013-06-24 | Disposition: A | Discharge status: Home / Self Care | Payer: BC Managed Care – PPO | Source: Ambulatory Visit | Attending: Obstetrics & Gynecology | Admitting: Obstetrics & Gynecology

## 2013-06-24 DIAGNOSIS — O923 Agalactia: Secondary | ICD-10-CM | POA: Insufficient documentation

## 2013-07-24 ENCOUNTER — Encounter (HOSPITAL_COMMUNITY)
Admission: RE | Admit: 2013-07-24 | Discharge: 2013-07-24 | Disposition: A | Discharge status: Home / Self Care | Payer: BC Managed Care – PPO | Source: Ambulatory Visit | Attending: Obstetrics & Gynecology | Admitting: Obstetrics & Gynecology

## 2013-07-24 DIAGNOSIS — O923 Agalactia: Secondary | ICD-10-CM | POA: Insufficient documentation

## 2013-08-24 ENCOUNTER — Encounter (HOSPITAL_COMMUNITY)
Admission: RE | Admit: 2013-08-24 | Discharge: 2013-08-24 | Disposition: A | Discharge status: Home / Self Care | Payer: BC Managed Care – PPO | Source: Ambulatory Visit | Attending: Obstetrics & Gynecology | Admitting: Obstetrics & Gynecology

## 2013-08-24 DIAGNOSIS — O923 Agalactia: Secondary | ICD-10-CM | POA: Diagnosis present

## 2013-09-24 ENCOUNTER — Encounter (HOSPITAL_COMMUNITY)
Admission: RE | Admit: 2013-09-24 | Discharge: 2013-09-24 | Disposition: A | Discharge status: Home / Self Care | Payer: BC Managed Care – PPO | Source: Ambulatory Visit | Attending: Obstetrics & Gynecology | Admitting: Obstetrics & Gynecology

## 2013-09-24 DIAGNOSIS — O923 Agalactia: Secondary | ICD-10-CM | POA: Diagnosis present

## 2013-10-24 ENCOUNTER — Encounter (HOSPITAL_COMMUNITY)
Admission: RE | Admit: 2013-10-24 | Discharge: 2013-10-24 | Disposition: A | Discharge status: Home / Self Care | Payer: BC Managed Care – PPO | Source: Ambulatory Visit | Attending: Obstetrics & Gynecology | Admitting: Obstetrics & Gynecology

## 2013-10-24 DIAGNOSIS — O923 Agalactia: Secondary | ICD-10-CM | POA: Diagnosis present

## 2013-11-24 ENCOUNTER — Encounter (HOSPITAL_COMMUNITY): Payer: BC Managed Care – PPO

## 2013-12-24 ENCOUNTER — Encounter (HOSPITAL_COMMUNITY): Payer: BC Managed Care – PPO | Attending: Obstetrics & Gynecology

## 2013-12-24 DIAGNOSIS — O923 Agalactia: Secondary | ICD-10-CM | POA: Diagnosis not present

## 2014-01-23 ENCOUNTER — Encounter (HOSPITAL_COMMUNITY)
Admission: RE | Admit: 2014-01-23 | Discharge: 2014-01-23 | Disposition: A | Discharge status: Home / Self Care | Payer: BC Managed Care – PPO | Source: Ambulatory Visit | Attending: Obstetrics & Gynecology | Admitting: Obstetrics & Gynecology

## 2014-01-23 DIAGNOSIS — O923 Agalactia: Secondary | ICD-10-CM | POA: Diagnosis not present

## 2015-08-04 ENCOUNTER — Encounter (HOSPITAL_COMMUNITY): Payer: Self-pay | Admitting: Emergency Medicine

## 2015-08-04 ENCOUNTER — Emergency Department (HOSPITAL_COMMUNITY)
Admission: EM | Admit: 2015-08-04 | Discharge: 2015-08-04 | Disposition: A | Discharge status: Home / Self Care | Payer: BLUE CROSS/BLUE SHIELD | Attending: Emergency Medicine | Admitting: Emergency Medicine

## 2015-08-04 DIAGNOSIS — R42 Dizziness and giddiness: Secondary | ICD-10-CM

## 2015-08-04 DIAGNOSIS — Z87891 Personal history of nicotine dependence: Secondary | ICD-10-CM | POA: Insufficient documentation

## 2015-08-04 DIAGNOSIS — E119 Type 2 diabetes mellitus without complications: Secondary | ICD-10-CM | POA: Insufficient documentation

## 2015-08-04 DIAGNOSIS — R55 Syncope and collapse: Secondary | ICD-10-CM

## 2015-08-04 LAB — URINE MICROSCOPIC-ADD ON

## 2015-08-04 LAB — CBC WITH DIFFERENTIAL/PLATELET
Basophils Absolute: 0 K/uL (ref 0.0–0.1)
Basophils Relative: 0 %
Eosinophils Absolute: 0 K/uL (ref 0.0–0.7)
Eosinophils Relative: 0 %
HCT: 34.7 % — ABNORMAL LOW (ref 36.0–46.0)
Hemoglobin: 10.8 g/dL — ABNORMAL LOW (ref 12.0–15.0)
Lymphocytes Relative: 15 %
Lymphs Abs: 1.8 K/uL (ref 0.7–4.0)
MCH: 25.2 pg — ABNORMAL LOW (ref 26.0–34.0)
MCHC: 31.1 g/dL (ref 30.0–36.0)
MCV: 80.9 fL (ref 78.0–100.0)
Monocytes Absolute: 0.5 K/uL (ref 0.1–1.0)
Monocytes Relative: 4 %
Neutro Abs: 9.7 K/uL — ABNORMAL HIGH (ref 1.7–7.7)
Neutrophils Relative %: 81 %
Platelets: 282 K/uL (ref 150–400)
RBC: 4.29 MIL/uL (ref 3.87–5.11)
RDW: 15.4 % (ref 11.5–15.5)
WBC: 12.1 K/uL — ABNORMAL HIGH (ref 4.0–10.5)

## 2015-08-04 LAB — COMPREHENSIVE METABOLIC PANEL WITH GFR
ALT: 16 U/L (ref 14–54)
AST: 19 U/L (ref 15–41)
Albumin: 4 g/dL (ref 3.5–5.0)
Alkaline Phosphatase: 55 U/L (ref 38–126)
Anion gap: 7 (ref 5–15)
BUN: 8 mg/dL (ref 6–20)
CO2: 21 mmol/L — ABNORMAL LOW (ref 22–32)
Calcium: 8.9 mg/dL (ref 8.9–10.3)
Chloride: 108 mmol/L (ref 101–111)
Creatinine, Ser: 0.49 mg/dL (ref 0.44–1.00)
GFR calc Af Amer: >60 mL/min
GFR calc non Af Amer: >60 mL/min
Glucose, Bld: 94 mg/dL (ref 65–99)
Potassium: 3.4 mmol/L — ABNORMAL LOW (ref 3.5–5.1)
Sodium: 136 mmol/L (ref 135–145)
Total Bilirubin: 0.5 mg/dL (ref 0.3–1.2)
Total Protein: 7.2 g/dL (ref 6.5–8.1)

## 2015-08-04 LAB — I-STAT TROPONIN, ED: Troponin i, poc: 0 ng/mL (ref 0.00–0.08)

## 2015-08-04 LAB — URINALYSIS, ROUTINE W REFLEX MICROSCOPIC
Bilirubin Urine: NEGATIVE
Glucose, UA: NEGATIVE mg/dL
Ketones, ur: NEGATIVE mg/dL
Leukocytes, UA: NEGATIVE
Nitrite: POSITIVE — AB
Protein, ur: NEGATIVE mg/dL
Specific Gravity, Urine: 1.026 (ref 1.005–1.030)
pH: 6.5 (ref 5.0–8.0)

## 2015-08-04 LAB — I-STAT BETA HCG BLOOD, ED (MC, WL, AP ONLY): I-stat hCG, quantitative: <5 m[IU]/mL

## 2015-08-04 LAB — LIPASE, BLOOD: Lipase: 17 U/L (ref 11–51)

## 2015-08-04 MED ORDER — ONDANSETRON HCL 4 MG/2ML IJ SOLN
4.0000 mg | Freq: Once | INTRAMUSCULAR | Status: AC
  Administered 2015-08-04: 4 mg via INTRAVENOUS
  Filled 2015-08-04: qty 2

## 2015-08-04 MED ORDER — ONDANSETRON 4 MG PO TBDP: 4.0000 mg | ORAL_TABLET | Freq: Three times a day (TID) | ORAL | Status: DC | PRN

## 2015-08-04 MED ORDER — METOCLOPRAMIDE HCL 5 MG/ML IJ SOLN
10.0000 mg | Freq: Once | INTRAMUSCULAR | Status: AC
  Administered 2015-08-04: 10 mg via INTRAVENOUS
  Filled 2015-08-04: qty 2

## 2015-08-04 MED ORDER — IBUPROFEN 800 MG PO TABS
800.0000 mg | ORAL_TABLET | Freq: Once | ORAL | Status: AC
  Administered 2015-08-04: 800 mg via ORAL
  Filled 2015-08-04: qty 1

## 2015-08-04 MED ORDER — SODIUM CHLORIDE 0.9 % IV BOLUS (SEPSIS)
500.0000 mL | Freq: Once | INTRAVENOUS | Status: AC
  Administered 2015-08-04: 500 mL via INTRAVENOUS

## 2015-08-04 NOTE — ED Notes (Signed)
i-Stat Trop.- 0.00 ng/mL  i-Stat beta hCG- <5.0 IU/L

## 2015-08-04 NOTE — Discharge Instructions (Signed)
Syncope °Syncope is a medical term for fainting or passing out. This means you lose consciousness and drop to the ground. People are generally unconscious for less than 5 minutes. You may have some muscle twitches for up to 15 seconds before waking up and returning to normal. Syncope occurs more often in older adults, but it can happen to anyone. While most causes of syncope are not dangerous, syncope can be a sign of a serious medical problem. It is important to seek medical care.  °CAUSES  °Syncope is caused by a sudden drop in blood flow to the brain. The specific cause is often not determined. Factors that can bring on syncope include: °· Taking medicines that lower blood pressure. °· Sudden changes in posture, such as standing up quickly. °· Taking more medicine than prescribed. °· Standing in one place for too long. °· Seizure disorders. °· Dehydration and excessive exposure to heat. °· Low blood sugar (hypoglycemia). °· Straining to have a bowel movement. °· Heart disease, irregular heartbeat, or other circulatory problems. °· Fear, emotional distress, seeing blood, or severe pain. °SYMPTOMS  °Right before fainting, you may: °· Feel dizzy or light-headed. °· Feel nauseous. °· See all white or all black in your field of vision. °· Have cold, clammy skin. °DIAGNOSIS  °Your health care provider will ask about your symptoms, perform a physical exam, and perform an electrocardiogram (ECG) to record the electrical activity of your heart. Your health care provider may also perform other heart or blood tests to determine the cause of your syncope which may include: °· Transthoracic echocardiogram (TTE). During echocardiography, sound waves are used to evaluate how blood flows through your heart. °· Transesophageal echocardiogram (TEE). °· Cardiac monitoring. This allows your health care provider to monitor your heart rate and rhythm in real time. °· Holter monitor. This is a portable device that records your  heartbeat and can help diagnose heart arrhythmias. It allows your health care provider to track your heart activity for several days, if needed. °· Stress tests by exercise or by giving medicine that makes the heart beat faster. °TREATMENT  °In most cases, no treatment is needed. Depending on the cause of your syncope, your health care provider may recommend changing or stopping some of your medicines. °HOME CARE INSTRUCTIONS °· Have someone stay with you until you feel stable. °· Do not drive, use machinery, or play sports until your health care provider says it is okay. °· Keep all follow-up appointments as directed by your health care provider. °· Lie down right away if you start feeling like you might faint. Breathe deeply and steadily. Wait until all the symptoms have passed. °· Drink enough fluids to keep your urine clear or pale yellow. °· If you are taking blood pressure or heart medicine, get up slowly and take several minutes to sit and then stand. This can reduce dizziness. °SEEK IMMEDIATE MEDICAL CARE IF:  °· You have a severe headache. °· You have unusual pain in the chest, abdomen, or back. °· You are bleeding from your mouth or rectum, or you have black or tarry stool. °· You have an irregular or very fast heartbeat. °· You have pain with breathing. °· You have repeated fainting or seizure-like jerking during an episode. °· You faint when sitting or lying down. °· You have confusion. °· You have trouble walking. °· You have severe weakness. °· You have vision problems. °If you fainted, call your local emergency services (911 in [place] Do not drive   yourself to the hospital.    This information is not intended to replace advice given to you by your health care provider. Make sure you discuss any questions you have with your health care provider.   Follow up with Dr. Ellan Lambertnasanya with neurology for re-evaluation. Encourage adequate hydration, drink plenty of fluids. Take home ibuprofen as needed for  headache. Take zofran as needed for nausea. Return to the ED if you experience severe worsening of your symptoms, loss of consciousness, seizure like activity, blurry vision, fevers, chills, weakness, chest pain or difficulty breathing.

## 2015-08-04 NOTE — ED Provider Notes (Signed)
CSN: [id number]     Arrival date & time 08/04/15  0911 History   First MD Initiated Contact with Patient 08/04/15 1006     Chief Complaint  Patient presents with  . Loss of Consciousness  . Emesis     (Consider location/radiation/quality/duration/timing/severity/associated sxs/prior Treatment) HPI   Jacqueline Bailey is a 30 year old female with no significant past medical history presents to the ED today to be evaluated after a syncopal event. Patient states that when she woke up this morning she felt very nauseous and had a few episodes of nonbloody, nonbilious emesis as well as diarrhea. She went on to work and felt very lightheaded. Patient states that she passed out and was lowered to the floor by a friend. She do not hit her head. Patient states that when she woke up her friend told her "that I had a seizure". She denies any urinary incontinence. Patient has no history of seizures. No postictal state. She denies any fevers, chills, chest pain or shortness of breath, blurry vision, dysuria, abdominal pain, vaginal discharge. Her last menstrual period was June 15. She does not take birth control and is sexually active with one partner.  Past Medical History  Diagnosis Date  . Diabetes mellitus without complication (HCC)   . Gestational diabetes     diet controlled   Past Surgical History  Procedure Laterality Date  . Cesarean section N/A 05/16/2012    Procedure: CESAREAN SECTION;  Surgeon: Tresa EndoKelly A. Ernestina PennaFogleman, MD;  Location: [facility];  Service: Obstetrics;  Laterality: N/A;   No family history on file. Social History  Substance Use Topics  . Smoking status: Former Smoker    Quit date: 05/15/2011  . Smokeless tobacco: Never Used  . Alcohol Use: No   OB History    Gravida Para Term Preterm AB TAB SAB Ectopic Multiple Living   1 1 1       1      Review of Systems  All other systems reviewed and are negative.     Allergies  Review of patient's allergies indicates no known  allergies.  Home Medications   Prior to Admission medications   Medication Sig Start Date End Date Taking? Authorizing Provider  azithromycin (ZITHROMAX Z-PAK) 250 MG tablet Take as directed on pack 01/16/13   Elvina SidleKurt Lauenstein, MD  HYDROcodone-homatropine [facility](HYCODAN) 5-1.5 MG/5ML syrup Take 5 mLs by mouth every 8 (eight) hours as needed for cough. 01/16/13   Elvina SidleKurt Lauenstein, MD  ibuprofen (ADVIL,MOTRIN) 600 MG tablet Take 1 tablet (600 mg total) by mouth every 6 (six) hours. 05/18/12   Marlinda Mikeanya Bailey, CNM   BP 125/84 mmHg  Pulse 95  Temp(Src) 98.1 F (36.7 C) (Oral)  Resp 16  SpO2 100% Physical Exam  Constitutional: She is oriented to person, place, and time. She appears well-developed and well-nourished. No distress.  HENT:  Head: Normocephalic and atraumatic.  Mouth/Throat: No oropharyngeal exudate.  Eyes: Conjunctivae and EOM are normal. Pupils are equal, round, and reactive to light. Right eye exhibits no discharge. Left eye exhibits no discharge. No scleral icterus.  Neck: Neck supple.  Cardiovascular: Normal rate, regular rhythm, normal heart sounds and intact distal pulses.  Exam reveals no gallop and no friction rub.   No murmur heard. Pulmonary/Chest: Effort normal and breath sounds normal. No respiratory distress. She has no wheezes. She has no rales. She exhibits no tenderness.  Abdominal: Soft. She exhibits no distension. There is no tenderness. There is no guarding.  Musculoskeletal: Normal range of motion. She  exhibits no edema.  Lymphadenopathy:    She has no cervical adenopathy.  Neurological: She is alert and oriented to person, place, and time. No cranial nerve deficit.  Strength 5/5 throughout. No sensory deficits.  No dysmetria. No gait abnormality.  Skin: Skin is warm and dry. No rash noted. She is not diaphoretic. No erythema. No pallor.  Psychiatric: She has a normal mood and affect. Her behavior is normal.  Nursing note and vitals reviewed.   ED Course   Procedures (including critical care time) Labs Review Labs Reviewed  COMPREHENSIVE METABOLIC PANEL - Abnormal; Notable for the following:    Potassium 3.4 (*)    CO2 21 (*)    All other components within normal limits  CBC WITH DIFFERENTIAL/PLATELET - Abnormal; Notable for the following:    WBC 12.1 (*)    Hemoglobin 10.8 (*)    HCT 34.7 (*)    MCH 25.2 (*)    Neutro Abs 9.7 (*)    All other components within normal limits  URINALYSIS, ROUTINE W REFLEX MICROSCOPIC (NOT AT [facility]) - Abnormal; Notable for the following:    APPearance CLOUDY (*)    Hgb urine dipstick SMALL (*)    Nitrite POSITIVE (*)    All other components within normal limits  URINE MICROSCOPIC-ADD ON - Abnormal; Notable for the following:    Squamous Epithelial / LPF 6-30 (*)    Bacteria, UA MANY (*)    All other components within normal limits  URINE CULTURE  LIPASE, BLOOD  I-STAT BETA HCG BLOOD, ED ([facility], [facility], [facility] ONLY)  I-STAT TROPOININ, ED    Imaging Review No results found. I have personally reviewed and evaluated these images and lab results as part of my medical decision-making.   EKG Interpretation   Date/Time:  Tuesday August 04 2015 11:18:31 EDT Ventricular Rate:  91 PR Interval:    QRS Duration: 83 QT Interval:  352 QTC Calculation: 433 R Axis:   54 Text Interpretation:  Sinus rhythm No old tracing to compare Confirmed by  KNAPP  MD-J, JON ([id number]) on 08/04/2015 11:20:56 AM      MDM   Final diagnoses:  Lightheadedness  Syncope, unspecified syncope type    Otherwise healthy 30 year old female presents to the ED today to be evaluated after a syncopal event that occurred while at work today. Patient states that when she woke up this morning she was nauseous and began vomiting. She went to work and was feeling lightheaded when she states she stood up and passed out. She was told by her coworker who caught her and lowered her to the floor that she had a "seizure". No urinary incontinence. No  postictal state. Patient has no neurological deficits noted on exam and no history of seizures. Question whether there was actual seizure-like activity. Patient appears well in the ED, all vital signs are stable. EKG unremarkable. No heart murmur heard on exam. No orthostasis present. Urine is nitrite positive with many bacteria. There is also 6-30 squamous cells. She is not having any dysuria. We'll defer treatment at this time and obtain urine culture. Patient was monitored in the ED for several hours with no recurrent symptoms, syncope or seizure-like activity. Feel that she is safe for follow-up with neurology. Referral given. Return precautions outlined in patient discharge instructions.    Lester Kinsman [place], PA-C 08/04/15 2003  Linwood Dibbles, MD 08/05/15 [phone]

## 2015-08-04 NOTE — ED Notes (Signed)
N/v/d since  This am at 530 and while at work passed out, did not hit floor or head was helped to floor by friend, pt woke up no shaking, no incontinence, has IV 18 left ac c/o abd pain  Across lower abd, denies dysuria or vag d/c

## 2015-08-04 NOTE — ED Notes (Signed)
Pt advised she felt more dizzy when going from sitting to standing.  No orthostatic changes.

## 2015-08-05 ENCOUNTER — Telehealth (HOSPITAL_BASED_OUTPATIENT_CLINIC_OR_DEPARTMENT_OTHER): Payer: Self-pay | Admitting: Emergency Medicine

## 2015-08-05 LAB — URINE CULTURE

## 2015-08-06 ENCOUNTER — Emergency Department (HOSPITAL_COMMUNITY): Payer: BLUE CROSS/BLUE SHIELD

## 2015-08-06 ENCOUNTER — Emergency Department (HOSPITAL_COMMUNITY)
Admission: EM | Admit: 2015-08-06 | Discharge: 2015-08-06 | Disposition: A | Discharge status: Home / Self Care | Payer: BLUE CROSS/BLUE SHIELD | Attending: Emergency Medicine | Admitting: Emergency Medicine

## 2015-08-06 ENCOUNTER — Encounter (HOSPITAL_COMMUNITY): Payer: Self-pay | Admitting: Emergency Medicine

## 2015-08-06 DIAGNOSIS — R42 Dizziness and giddiness: Secondary | ICD-10-CM | POA: Diagnosis present

## 2015-08-06 DIAGNOSIS — Z87891 Personal history of nicotine dependence: Secondary | ICD-10-CM | POA: Insufficient documentation

## 2015-08-06 DIAGNOSIS — N390 Urinary tract infection, site not specified: Secondary | ICD-10-CM | POA: Insufficient documentation

## 2015-08-06 DIAGNOSIS — R251 Tremor, unspecified: Secondary | ICD-10-CM

## 2015-08-06 DIAGNOSIS — F445 Conversion disorder with seizures or convulsions: Secondary | ICD-10-CM

## 2015-08-06 DIAGNOSIS — E119 Type 2 diabetes mellitus without complications: Secondary | ICD-10-CM | POA: Insufficient documentation

## 2015-08-06 LAB — CBC WITH DIFFERENTIAL/PLATELET
BASOS ABS: 0 10*3/uL (ref 0.0–0.1)
Basophils Relative: 0 %
EOS ABS: 0.1 10*3/uL (ref 0.0–0.7)
Eosinophils Relative: 1 %
HEMATOCRIT: 33.7 % — AB (ref 36.0–46.0)
Hemoglobin: 10.3 g/dL — ABNORMAL LOW (ref 12.0–15.0)
Lymphocytes Relative: 30 %
Lymphs Abs: 3.1 10*3/uL (ref 0.7–4.0)
MCH: 24.9 pg — AB (ref 26.0–34.0)
MCHC: 30.6 g/dL (ref 30.0–36.0)
MCV: 81.4 fL (ref 78.0–100.0)
Monocytes Absolute: 0.7 10*3/uL (ref 0.1–1.0)
Monocytes Relative: 7 %
NEUTROS PCT: 62 %
Neutro Abs: 6.4 10*3/uL (ref 1.7–7.7)
PLATELETS: 308 10*3/uL (ref 150–400)
RBC: 4.14 MIL/uL (ref 3.87–5.11)
RDW: 15.4 % (ref 11.5–15.5)
WBC: 10.4 10*3/uL (ref 4.0–10.5)

## 2015-08-06 LAB — COMPREHENSIVE METABOLIC PANEL
ALK PHOS: 48 U/L (ref 38–126)
ALT: 16 U/L (ref 14–54)
AST: 21 U/L (ref 15–41)
Albumin: 3.9 g/dL (ref 3.5–5.0)
Anion gap: 11 (ref 5–15)
BILIRUBIN TOTAL: 0.5 mg/dL (ref 0.3–1.2)
BUN: 6 mg/dL (ref 6–20)
CALCIUM: 9.4 mg/dL (ref 8.9–10.3)
CO2: 19 mmol/L — ABNORMAL LOW (ref 22–32)
CREATININE: 0.6 mg/dL (ref 0.44–1.00)
Chloride: 108 mmol/L (ref 101–111)
Glucose, Bld: 94 mg/dL (ref 65–99)
Potassium: 3.5 mmol/L (ref 3.5–5.1)
Sodium: 138 mmol/L (ref 135–145)
TOTAL PROTEIN: 7.1 g/dL (ref 6.5–8.1)

## 2015-08-06 LAB — URINE MICROSCOPIC-ADD ON

## 2015-08-06 LAB — RAPID URINE DRUG SCREEN, HOSP PERFORMED
Amphetamines: NOT DETECTED
BENZODIAZEPINES: NOT DETECTED
Barbiturates: NOT DETECTED
COCAINE: NOT DETECTED
OPIATES: NOT DETECTED
Tetrahydrocannabinol: NOT DETECTED

## 2015-08-06 LAB — URINALYSIS, ROUTINE W REFLEX MICROSCOPIC
Bilirubin Urine: NEGATIVE
GLUCOSE, UA: NEGATIVE mg/dL
KETONES UR: NEGATIVE mg/dL
LEUKOCYTES UA: NEGATIVE
Nitrite: POSITIVE — AB
PH: 5.5 (ref 5.0–8.0)
Protein, ur: NEGATIVE mg/dL
Specific Gravity, Urine: 1.02 (ref 1.005–1.030)

## 2015-08-06 MED ORDER — SODIUM CHLORIDE 0.9 % IV BOLUS (SEPSIS)
1000.0000 mL | Freq: Once | INTRAVENOUS | Status: AC
  Administered 2015-08-06: 1000 mL via INTRAVENOUS

## 2015-08-06 MED ORDER — NITROFURANTOIN MONOHYD MACRO 100 MG PO CAPS: 100.0000 mg | ORAL_CAPSULE | Freq: Two times a day (BID) | ORAL | Status: DC

## 2015-08-06 MED ORDER — DEXTROSE 5 % IV SOLN
1.0000 g | Freq: Once | INTRAVENOUS | Status: AC
  Administered 2015-08-06: 1 g via INTRAVENOUS
  Filled 2015-08-06: qty 10

## 2015-08-06 NOTE — Progress Notes (Signed)
EEG completed; results pending. Pt did have several of what she states are her typical episodes of concern during EEG, neurology made aware.

## 2015-08-06 NOTE — Consult Note (Addendum)
NEURO HOSPITALIST CONSULT NOTE   Requestig physician: Dr. Pecola Leisure   Reason for Consult: possible seizure versus non-epileptic spells    History obtained from:  Patient    HPI:                                                                                                                                          Jacqueline Bailey is an 30 y.o. female who was here 2 days prior. Per note "to be evaluated after a syncopal event. Patient states that when she woke up this morning she felt very nauseous and had a few episodes of nonbloody, nonbilious emesis as well as diarrhea. She went on to work and felt very lightheaded. Patient states that she passed out and was lowered to the floor by a friend. She do not hit her head. Patient states that when she woke up her friend told her "that I had a seizure". She denies any urinary incontinence. Patient has no history of seizures".  Patient returns to the hospital today due to continued sensation of dizziness and episodes of shaking. She is VERY vague in her history and description. She states it is bilateral and boyfriend also states it is a fine tremor bilaterally but she is able to converse through the episode at time and other times able to follow directions but not talk. She had a 2 episodes in ED in a 40 minute period. One was witness by ED MD and described as stiff left leg and able to talk and follow directions. No generalization. She did have increased HR. unfortunately no distracting maneuvers were made to see if it could be distract able.   She denies any birthing trauma, family history of Sz, stress, head injury but does admit to lack of sleep.   Past Medical History  Diagnosis Date  . Diabetes mellitus without complication (HCC)   . Gestational diabetes     diet controlled    Past Surgical History  Procedure Laterality Date  . Cesarean section N/A 05/16/2012    Procedure: CESAREAN SECTION;  Surgeon: Tresa Endo A. Ernestina Penna, MD;   Location: [facility];  Service: Obstetrics;  Laterality: N/A;    Family History  Problem Relation Age of Onset  . Hypertension Mother   . Hyperlipidemia Mother   . Hyperlipidemia Father   . Hypertension Father      Social History:  reports that she quit smoking about 4 years ago. She has never used smokeless tobacco. She reports that she does not drink alcohol or use illicit drugs.  No Known Allergies  MEDICATIONS:  No current facility-administered medications for this encounter.   Current Outpatient Prescriptions  Medication Sig Dispense Refill  . acetaminophen (TYLENOL) 500 MG tablet Take 500 mg by mouth every 6 (six) hours as needed.    . ondansetron (ZOFRAN ODT) 4 MG disintegrating tablet Take 1 tablet (4 mg total) by mouth every 8 (eight) hours as needed for nausea or vomiting. 20 tablet 0      ROS:                                                                                                                                       History obtained from the patient  General ROS: negative for - chills, fatigue, fever, night sweats, weight gain or weight loss Psychological ROS: negative for - behavioral disorder, hallucinations, memory difficulties, mood swings or suicidal ideation Ophthalmic ROS: negative for - blurry vision, double vision, eye pain or loss of vision ENT ROS: negative for - epistaxis, nasal discharge, oral lesions, sore throat, tinnitus or vertigo Allergy and Immunology ROS: negative for - hives or itchy/watery eyes Hematological and Lymphatic ROS: negative for - bleeding problems, bruising or swollen lymph nodes Endocrine ROS: negative for - galactorrhea, hair pattern changes, polydipsia/polyuria or temperature intolerance Respiratory ROS: negative for - cough, hemoptysis, shortness of breath or wheezing Cardiovascular ROS: negative for - chest  pain, dyspnea on exertion, edema or irregular heartbeat Gastrointestinal ROS: negative for - abdominal pain, diarrhea, hematemesis, nausea/vomiting or stool incontinence Genito-Urinary ROS: negative for - dysuria, hematuria, incontinence or urinary frequency/urgency Musculoskeletal ROS: negative for - joint swelling or muscular weakness Neurological ROS: as noted in HPI Dermatological ROS: negative for rash and skin lesion changes   Blood pressure 138/98, pulse 80, temperature 98.5 F (36.9 C), temperature source Oral, resp. rate 26, height 5\' 1"  (1.549 m), weight 64.411 kg (142 lb), last menstrual period 07/09/2015, SpO2 100 %.   Neurologic Examination:                                                                                                      HEENT-  Normocephalic, no lesions, without obvious abnormality.  Normal external eye and conjunctiva.  Normal TM's bilaterally.  Normal auditory canals and external ears. Normal external nose, mucus membranes and septum.  Normal pharynx. Cardiovascular- S1, S2 normal, pulses palpable throughout   Lungs- no tachypnea, retractions or cyanosis Abdomen- normal findings: bowel sounds normal Extremities- no edema Lymph-no adenopathy palpable Musculoskeletal-no joint tenderness, deformity or swelling Skin-warm and dry, no hyperpigmentation,  vitiligo, or suspicious lesions  Neurological Examination Mental Status: Alert, oriented, thought content appropriate.  Speech fluent without evidence of aphasia.  Able to follow 3 step commands without difficulty. Cranial Nerves: II: Discs flat bilaterally; Visual fields grossly normal, pupils equal, round, reactive to light and accommodation III,IV, VI: ptosis not present, extra-ocular motions intact bilaterally V,VII: smile symmetric, facial light touch sensation normal bilaterally VIII: hearing normal bilaterally IX,X: uvula rises symmetrically XI: bilateral shoulder shrug XII: midline tongue  extension Motor: Right : Upper extremity   5/5    Left:     Upper extremity   5/5  Lower extremity   5/5     Lower extremity   5/5 Tone and bulk:normal tone throughout; no atrophy noted Sensory: Pinprick and light touch intact throughout, bilaterally Deep Tendon Reflexes: 2+ and symmetric throughout Plantars: Right: downgoing   Left: downgoing Cerebellar: normal finger-to-nose, normal rapid alternating movements and normal heel-to-shin test Gait: not tested      Lab Results: Basic Metabolic Panel:  Recent Labs Lab 08/04/15 1039 08/06/15 0950  NA 136 138  K 3.4* 3.5  CL 108 108  CO2 21* 19*  GLUCOSE 94 94  BUN 8 6  CREATININE 0.49 0.60  CALCIUM 8.9 9.4    Liver Function Tests:  Recent Labs Lab 08/04/15 1039 08/06/15 0950  AST 19 21  ALT 16 16  ALKPHOS 55 48  BILITOT 0.5 0.5  PROT 7.2 7.1  ALBUMIN 4.0 3.9    Recent Labs Lab 08/04/15 1039  LIPASE 17   No results for input(s): AMMONIA in the last 168 hours.  CBC:  Recent Labs Lab 08/04/15 1039 08/06/15 0950  WBC 12.1* 10.4  NEUTROABS 9.7* 6.4  HGB 10.8* 10.3*  HCT 34.7* 33.7*  MCV 80.9 81.4  PLT 282 308    Cardiac Enzymes: No results for input(s): CKTOTAL, CKMB, CKMBINDEX, TROPONINI in the last 168 hours.  Lipid Panel: No results for input(s): CHOL, TRIG, HDL, CHOLHDL, VLDL, LDLCALC in the last 168 hours.  CBG: No results for input(s): GLUCAP in the last 168 hours.  Microbiology: Results for orders placed or performed during the hospital encounter of 08/04/15  Urine culture     Status: Abnormal   Collection Time: 08/04/15 11:32 AM  Result Value Ref Range Status   Specimen Description URINE, CLEAN CATCH  Final   Special Requests NONE  Final   Culture MULTIPLE SPECIES PRESENT, SUGGEST RECOLLECTION (A)  Final   Report Status 08/05/2015 FINAL  Final    Coagulation Studies: No results for input(s): LABPROT, INR in the last 72 hours.  Imaging: Ct Head Wo Contrast  08/06/2015  CLINICAL  DATA:  Headache, possible seizure. EXAM: CT HEAD WITHOUT CONTRAST TECHNIQUE: Contiguous axial images were obtained from the base of the skull through the vertex without intravenous contrast. COMPARISON:  None. FINDINGS: Bony calvarium appears intact. No mass effect or midline shift is noted. Ventricular size is within normal limits. There is no evidence of mass lesion, hemorrhage or acute infarction. IMPRESSION: Normal head CT. Electronically Signed   By: Lupita Raider, M.D.   On: 08/06/2015 10:39       Assessment and plan per attending neurologist  Felicie Morn PA-C [facility] [phone]  08/06/2015, 11:54 AM   Assessment/Plan: 30 YO female with shaking episodes. Unfortunately, I was unable to witness such episode. CT head is normal and there is no metabolic abnormalities. It is unclear if this is true simple partial versus non-epileptic. Will obtain EEG in ED  hoping to capture spell.    - No AED's at this time - EEG to capture a spell, depending on what is found may need treatment and MR brain  Addendum EEG captured 2 spell which were NOT epileptiform. Final read to come. At this time no AED recommended.  Would recommend possible outpatient or inpatient psychology consultation to find out any underlining emotional or physiocal stressors that can be contributing to these spells.   Agree with above

## 2015-08-06 NOTE — Procedures (Signed)
EEG Report  Clinical history:  Syncope with ?seizure  Technical Summary:  A 19 channel digital EEG recording was performed using 10-20 international system of electrode placement.  Bipolar and referential montages were used.  Total recording time was 23 minutes.  Findings:  The patient has a posterior dominant rhythm of 10-11 Hz reactive to eye opening and closure.  No focal slowing is present.  The patient has 3 events, one of which was after photic stimulation.  In all 3 she has arching of the back and rocking back and forth movements of the trunk associated with non-rhythmic tremors of the hands and eye closure.   After the event which lasted about 10-30 seconds, she is breathing heavy, talking, and emotional/crying.  There were no EEG changes before, during, or after the events.  There were no epileptiform discharges or electrographic seizures present.    Impression:  This is a normal EEG in the awake state.  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    Weston SettleShervin Kemara Quigley, MD

## 2015-08-06 NOTE — ED Notes (Signed)
Seen here 7/11 for dizziness-- was told that she had uti-- and to follow up with neurologist- pt states is too dizzy to drive, "I am just miserable"

## 2015-08-06 NOTE — ED Notes (Signed)
Patient transported to EEG

## 2015-08-06 NOTE — ED Provider Notes (Signed)
CSN: [id number]     Arrival date & time 08/06/15  0848 History   First MD Initiated Contact with Patient 08/06/15 [phone]     Chief Complaint  Patient presents with  . Dizziness     Patient is a 30 y.o. female presenting with dizziness. The history is provided by the patient. No language interpreter was used.  Dizziness  Jacqueline SettersKristine Lantzy is a 30 y.o. female who presents to the Emergency Department complaining of dizziness.  She reports 2 days of dizziness sensation that began when she passed out at work. She reports feeling dizzy and lightheaded and feeling flushed at times symptoms are worse with standing. She has associated mild headache, nausea, shortness of breath. She reports feeling poor in general. Since last night she is developed shaking at times it lasts for 1-2 minutes and it can involve her whole body. She denies any fevers or chills. No numbness. No prior similar symptoms.  Past Medical History  Diagnosis Date  . Diabetes mellitus without complication (HCC)   . Gestational diabetes     diet controlled   Past Surgical History  Procedure Laterality Date  . Cesarean section N/A 05/16/2012    Procedure: CESAREAN SECTION;  Surgeon: Tresa EndoKelly A. Ernestina PennaFogleman, MD;  Location: [facility];  Service: Obstetrics;  Laterality: N/A;   Family History  Problem Relation Age of Onset  . Hypertension Mother   . Hyperlipidemia Mother   . Hyperlipidemia Father   . Hypertension Father    Social History  Substance Use Topics  . Smoking status: Former Smoker    Quit date: 05/15/2011  . Smokeless tobacco: Never Used  . Alcohol Use: No   OB History    Gravida Para Term Preterm AB TAB SAB Ectopic Multiple Living   1 1 1       1      Review of Systems  Neurological: Positive for dizziness.  All other systems reviewed and are negative.     Allergies  Review of patient's allergies indicates no known allergies.  Home Medications   Prior to Admission medications   Medication Sig Start Date End Date  Taking? Authorizing Provider  acetaminophen (TYLENOL) 500 MG tablet Take 500 mg by mouth every 6 (six) hours as needed.    Historical Provider, MD  nitrofurantoin, macrocrystal-monohydrate, (MACROBID) 100 MG capsule Take 1 capsule (100 mg total) by mouth 2 (two) times daily. 08/06/15   Tilden FossaElizabeth Marvelyn Bouchillon, MD  ondansetron (ZOFRAN ODT) 4 MG disintegrating tablet Take 1 tablet (4 mg total) by mouth every 8 (eight) hours as needed for nausea or vomiting. 08/04/15   Samantha Tripp [place], PA-C   BP 116/78 mmHg  Pulse 90  Temp(Src) 98.8 F (37.1 C) (Oral)  Resp 19  Ht 5\' 1"  (1.549 m)  Wt 142 lb (64.411 kg)  BMI 26.84 kg/m2  SpO2 100%  LMP 07/09/2015 Physical Exam  Constitutional: She is oriented to person, place, and time. She appears well-developed and well-nourished.  HENT:  Head: Normocephalic and atraumatic.  Eyes: EOM are normal. Pupils are equal, round, and reactive to light.  Cardiovascular: Normal rate and regular rhythm.   No murmur heard. Pulmonary/Chest: Effort normal and breath sounds normal. No respiratory distress.  Abdominal: Soft. There is no tenderness. There is no rebound and no guarding.  Musculoskeletal: She exhibits no edema or tenderness.  Neurological: She is alert and oriented to person, place, and time. No cranial nerve deficit. Coordination normal.  5 out of 5 strength in all four extremities.  Skin: Skin is warm and dry.  Psychiatric: She has a normal mood and affect. Her behavior is normal.  Nursing note and vitals reviewed.   ED Course  Procedures (including critical care time) Labs Review Labs Reviewed  COMPREHENSIVE METABOLIC PANEL - Abnormal; Notable for the following:    CO2 19 (*)    All other components within normal limits  CBC WITH DIFFERENTIAL/PLATELET - Abnormal; Notable for the following:    Hemoglobin 10.3 (*)    HCT 33.7 (*)    MCH 24.9 (*)    All other components within normal limits  URINALYSIS, ROUTINE W REFLEX MICROSCOPIC (NOT AT [facility]) -  Abnormal; Notable for the following:    APPearance CLOUDY (*)    Hgb urine dipstick SMALL (*)    Nitrite POSITIVE (*)    All other components within normal limits  URINE MICROSCOPIC-ADD ON - Abnormal; Notable for the following:    Squamous Epithelial / LPF 6-30 (*)    Bacteria, UA MANY (*)    All other components within normal limits  URINE RAPID DRUG SCREEN, HOSP PERFORMED    Imaging Review Ct Head Wo Contrast  08/06/2015  CLINICAL DATA:  Headache, possible seizure. EXAM: CT HEAD WITHOUT CONTRAST TECHNIQUE: Contiguous axial images were obtained from the base of the skull through the vertex without intravenous contrast. COMPARISON:  None. FINDINGS: Bony calvarium appears intact. No mass effect or midline shift is noted. Ventricular size is within normal limits. There is no evidence of mass lesion, hemorrhage or acute infarction. IMPRESSION: Normal head CT. Electronically Signed   By: Lupita Raider, M.D.   On: 08/06/2015 10:39   I have personally reviewed and evaluated these images and lab results as part of my medical decision-making.   EKG Interpretation None      MDM   Final diagnoses:  Pseudoseizure (HCC)  Acute UTI    Patient here for evaluation of dizziness that is described as a lightheadedness type sensation. While in the emergency department she developed a shaking episode where she was shaking all over but predominantly in the right lower extremity. She was conversant during the event. She could move everything except her right lower extremity on command. Prior to and following the event she could move all 4 extremities symmetrically. Event lasted less than 1 minute. She was able to say prior to the event commencing that it's about to start.  Neurology consultation for focal seizure versus pseudoseizure. She had an EEG performed in the emergency department that had recurrent event and no evidence of epileptiform activity. 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 UA is concerning for UTI, we'll treat her for cystitis with Macrobid. Discussed with patient findings of pseudoseizure and UTI. Discussed follow-up with PCP/therapist, outpatient follow-up, return precautions.  Tilden Fossa, MD 08/06/15 1534

## 2015-08-06 NOTE — ED Notes (Signed)
Pt reports dizziness with trouble with her depth perception since Saturday. Reports feeling faint at times and also reports she sometimes has tremors. Pt was seen here Tuesday for the same complaint. Pt reports dizziness has not gotten better. Pt also reports that she was diagnosed with a UTI but not given any antibiotic prescriptions.

## 2015-08-06 NOTE — Discharge Instructions (Signed)
Urinary Tract Infection Urinary tract infections (UTIs) can develop anywhere along your urinary tract. Your urinary tract is your body's drainage system for removing wastes and extra water. Your urinary tract includes two kidneys, two ureters, a bladder, and a urethra. Your kidneys are a pair of bean-shaped organs. Each kidney is about the size of your fist. They are located below your ribs, one on each side of your spine. CAUSES Infections are caused by microbes, which are microscopic organisms, including fungi, viruses, and bacteria. These organisms are so small that they can only be seen through a microscope. Bacteria are the microbes that most commonly cause UTIs. SYMPTOMS  Symptoms of UTIs may vary by age and gender of the patient and by the location of the infection. Symptoms in young women typically include a frequent and intense urge to urinate and a painful, burning feeling in the bladder or urethra during urination. Older women and men are more likely to be tired, shaky, and weak and have muscle aches and abdominal pain. A fever may mean the infection is in your kidneys. Other symptoms of a kidney infection include pain in your back or sides below the ribs, nausea, and vomiting. DIAGNOSIS To diagnose a UTI, your caregiver will ask you about your symptoms. Your caregiver will also ask you to provide a urine sample. The urine sample will be tested for bacteria and white blood cells. White blood cells are made by your body to help fight infection. TREATMENT  Typically, UTIs can be treated with medication. Because most UTIs are caused by a bacterial infection, they usually can be treated with the use of antibiotics. The choice of antibiotic and length of treatment depend on your symptoms and the type of bacteria causing your infection. HOME CARE INSTRUCTIONS  If you were prescribed antibiotics, take them exactly as your caregiver instructs you. Finish the medication even if you feel better after  you have only taken some of the medication.  Drink enough water and fluids to keep your urine clear or pale yellow.  Avoid caffeine, tea, and carbonated beverages. They tend to irritate your bladder.  Empty your bladder often. Avoid holding urine for long periods of time.  Empty your bladder before and after sexual intercourse.  After a bowel movement, women should cleanse from front to back. Use each tissue only once. SEEK MEDICAL CARE IF:   You have back pain.  You develop a fever.  Your symptoms do not begin to resolve within 3 days. SEEK IMMEDIATE MEDICAL CARE IF:   You have severe back pain or lower abdominal pain.  You develop chills.  You have nausea or vomiting.  You have continued burning or discomfort with urination. MAKE SURE YOU:   Understand these instructions.  Will watch your condition.  Will get help right away if you are not doing well or get worse.   This information is not intended to replace advice given to you by your health care provider. Make sure you discuss any questions you have with your health care provider.   Document Released: 10/20/2004 Document Revised: 10/01/2014 Document Reviewed: 02/18/2011 Elsevier Interactive Patient Education 2016 [employer]. Nonepileptic Seizures Nonepileptic seizures are seizures that are not caused by abnormal electrical signals in your brain. These seizures often seem like epileptic seizures, but they are not caused by epilepsy.  There are two types of nonepileptic seizures:  A physiologic nonepileptic seizure results from a disruption in your brain.  A psychogenic seizure results from emotional stress. These seizures  are sometimes called pseudoseizures. CAUSES  Causes of physiologic nonepileptic seizures include:   Sudden drop in blood pressure.  Low blood sugar.  Low levels of salt (sodium) in your blood.  Low levels of calcium in your blood.  Migraine.  Heart rhythm problems.  Sleep  disorders.  Drug and alcohol abuse. Common causes of psychogenic nonepileptic seizures include:  Stress.  Emotional trauma.  Sexual or physical abuse.  Major life events, such as divorce or the death of a loved one.  Mental health disorders, including panic attack and hyperactivity disorder. SIGNS AND SYMPTOMS A nonepileptic seizure can look like an epileptic seizure, including uncontrollable shaking (convulsions), or changes in attention, behavior, or the ability to remain awake and alert. However, there are some differences. Nonepileptic seizures usually:  Do not cause physical injuries.  Start slowly.  Include crying or shrieking.  Last longer than 2 minutes.  Have a short recovery time without headache or exhaustion. DIAGNOSIS  Your health care provider can usually diagnose nonepileptic seizures after taking your medical history and giving you a physical exam. Your health care provider may want to talk to your friends or relatives who have seen you have a seizure.  You may also need to have tests to look for causes of physiologic nonepileptic seizures. This may include an electroencephalogram (EEG), which is a test that measures electrical activity in your brain. If you have had an epileptic seizure, the results of your EEG will be abnormal. If your health care provider thinks you have had a psychogenic nonepileptic seizure, you may need to see a mental health specialist for an evaluation. TREATMENT  Treatment depends on the type and cause of your seizures.  For physiologic nonepileptic seizures, treatment is aimed at addressing the underlying condition that caused the seizures. These seizures usually stop when the underlying condition is properly treated.  Nonepileptic seizures do not respond to the seizure medicines used to treat epilepsy.  For psychogenic seizures, you may need to work with a mental health specialist. HOME CARE INSTRUCTIONS Home care will depend on the  type of nonepileptic seizures you have.   Follow all your health care provider's instructions.  Keep all your follow-up appointments. SEEK MEDICAL CARE IF: You continue to have seizures after treatment. SEEK IMMEDIATE MEDICAL CARE IF:  Your seizures change or become more frequent.  You injure yourself during a seizure.  You have one seizure after another.  You have trouble recovering from a seizure.  You have chest pain or trouble breathing. MAKE SURE YOU:  Understand these instructions.  Will watch your condition.  Will get help right away if you are not doing well or get worse.   This information is not intended to replace advice given to you by your health care provider. Make sure you discuss any questions you have with your health care provider.   Document Released: 02/25/2005 Document Revised: 01/31/2014 Document Reviewed: 11/06/2012 Elsevier Interactive Patient Education [employer]2016 Elsevier Inc.

## 2017-06-23 ENCOUNTER — Ambulatory Visit: Payer: 59 | Admitting: Family Medicine

## 2017-06-23 ENCOUNTER — Encounter: Payer: Self-pay | Admitting: Family Medicine

## 2017-06-23 VITALS — BP 146/97 | HR 89 | Temp 98.5°F | Resp 16 | Ht 63.0 in | Wt 154.0 lb

## 2017-06-23 DIAGNOSIS — N760 Acute vaginitis: Secondary | ICD-10-CM | POA: Diagnosis not present

## 2017-06-23 DIAGNOSIS — R1030 Lower abdominal pain, unspecified: Secondary | ICD-10-CM

## 2017-06-23 DIAGNOSIS — N12 Tubulo-interstitial nephritis, not specified as acute or chronic: Secondary | ICD-10-CM | POA: Diagnosis not present

## 2017-06-23 DIAGNOSIS — B9689 Other specified bacterial agents as the cause of diseases classified elsewhere: Secondary | ICD-10-CM

## 2017-06-23 DIAGNOSIS — R112 Nausea with vomiting, unspecified: Secondary | ICD-10-CM

## 2017-06-23 LAB — POCT CBC
Granulocyte percent: 82.7 %G — AB (ref 37–80)
HCT, POC: 35.4 % — AB (ref 37.7–47.9)
Hemoglobin: 11.4 g/dL — AB (ref 12.2–16.2)
Lymph, poc: 1.6 (ref 0.6–3.4)
MCH, POC: 26 pg — AB (ref 27–31.2)
MCHC: 32.2 g/dL (ref 31.8–35.4)
MCV: 80.6 fL (ref 80–97)
MID (cbc): 0.3 (ref 0–0.9)
MPV: 7.9 fL (ref 0–99.8)
POC Granulocyte: 9.3 — AB (ref 2–6.9)
POC LYMPH PERCENT: 14.6 %L (ref 10–50)
POC MID %: 2.7 %M (ref 0–12)
Platelet Count, POC: 368 10*3/uL (ref 142–424)
RBC: 4.39 M/uL (ref 4.04–5.48)
RDW, POC: 15.8 %
WBC: 11.3 10*3/uL — AB (ref 4.6–10.2)

## 2017-06-23 LAB — POC URINALSYSI DIPSTICK (AUTOMATED)
Bilirubin, UA: NEGATIVE
Glucose, UA: NEGATIVE
Ketones, UA: NEGATIVE
Nitrite, UA: NEGATIVE
Protein, UA: NEGATIVE
Spec Grav, UA: 1.025 (ref 1.010–1.025)
Urobilinogen, UA: 0.2 E.U./dL
pH, UA: 6 (ref 5.0–8.0)

## 2017-06-23 LAB — POC MICROSCOPIC URINALYSIS (UMFC)

## 2017-06-23 LAB — POCT WET + KOH PREP
Trich by wet prep: ABSENT
Yeast by KOH: ABSENT
Yeast by wet prep: ABSENT

## 2017-06-23 LAB — POCT URINE PREGNANCY: Preg Test, Ur: NEGATIVE

## 2017-06-23 MED ORDER — METRONIDAZOLE 500 MG PO TABS: 500.0000 mg | ORAL_TABLET | Freq: Two times a day (BID) | ORAL | 0 refills | Status: DC

## 2017-06-23 MED ORDER — SODIUM CHLORIDE 0.9 % IV BOLUS
1000.0000 mL | Freq: Once | INTRAVENOUS | Status: AC
  Administered 2017-06-23: 1000 mL via INTRAVENOUS

## 2017-06-23 MED ORDER — LEVOFLOXACIN 750 MG PO TABS: 750.0000 mg | ORAL_TABLET | Freq: Every day | ORAL | 0 refills | Status: DC

## 2017-06-23 MED ORDER — OMEPRAZOLE 40 MG PO CPDR: 40.0000 mg | DELAYED_RELEASE_CAPSULE | Freq: Every day | ORAL | 3 refills | Status: DC

## 2017-06-23 MED ORDER — ONDANSETRON HCL 4 MG PO TABS: 4.0000 mg | ORAL_TABLET | Freq: Three times a day (TID) | ORAL | 0 refills | Status: DC | PRN

## 2017-06-23 MED ORDER — CEFTRIAXONE SODIUM 1 G IJ SOLR
1.0000 g | Freq: Once | INTRAMUSCULAR | Status: AC
  Administered 2017-06-23: 1 g via INTRAMUSCULAR

## 2017-06-23 NOTE — Progress Notes (Signed)
5/31/201912:09 PM  Barbera SettersKristine Blowers 1985/09/17, 32 y.o. female [id number]  Chief Complaint  Patient presents with  . Vomiting    started at 6:00 am,per pt have vomitted 50 times, "clear to yellow fluid", last couple of times blood bright red  . Dizziness  . Abdominal Pain    "very painful," I'am on a pain scale10 right now    HPI:   Patient is a 32 y.o. female with past medical history significant for gastric ulcers and gestational diabetes who presents today for acute onset of vomiting with lower abd pain  She denies feeling fine yesterday, no new foods, no known exposure Then this morning started throwing up repeatedly, towards then end having scant bright red blood She had a loose stool this morning but no frank diarrhea, denies blood or melena She is having lower abd pain, worse in suprapubic area She denies any dysuria, flank pain, vaginal discharge, reports a normal period, [facility] She has not had anything to drink/eat since early this morning She is feeling very dizzy She denies any fever or chills  She reports recent increase in ibuprofen, as she has been having increase headaches She denies any etoh use, does not smoke  She has not thrown up since she arrived to clinic  No flowsheet data found.   No flowsheet data found.  No Known Allergies  Prior to Admission medications   Not on File    Past Medical History:  Diagnosis Date  . Diabetes mellitus without complication (HCC)   . Gestational diabetes    diet controlled    Past Surgical History:  Procedure Laterality Date  . CESAREAN SECTION N/A 05/16/2012   Procedure: CESAREAN SECTION;  Surgeon: Tresa EndoKelly A. Ernestina PennaFogleman, MD;  Location: [facility];  Service: Obstetrics;  Laterality: N/A;    Social History   Tobacco Use  . Smoking status: Former Smoker    Last attempt to quit: 05/15/2011    Years since quitting: 6.1  . Smokeless tobacco: Never Used  Substance Use Topics  . Alcohol use: No    Family History   Problem Relation Age of Onset  . Hypertension Mother   . Hyperlipidemia Mother   . Hyperlipidemia Father   . Hypertension Father     ROS Per hpi  OBJECTIVE:  Blood pressure (!) 146/97, pulse 89, temperature 98.5 F (36.9 C), temperature source Oral, resp. rate 16, height 5\' 3"  (1.6 m), weight 154 lb (69.9 kg), last menstrual period 06/18/2017, SpO2 100 %.  Physical Exam  Constitutional: She is oriented to person, place, and time. She appears well-developed and well-nourished.  HENT:  Head: Normocephalic and atraumatic.  Mouth/Throat: Oropharynx is clear and moist. Mucous membranes are dry. No oropharyngeal exudate.  Eyes: Pupils are equal, round, and reactive to light. EOM are normal. No scleral icterus.  Neck: Neck supple.  Cardiovascular: Normal rate, regular rhythm and normal heart sounds. Exam reveals no gallop and no friction rub.  No murmur heard. Pulmonary/Chest: Effort normal and breath sounds normal. She has no wheezes. She has no rales.  Abdominal: Soft. Bowel sounds are normal. There is no hepatosplenomegaly. There is no tenderness (across the lower abd, most prominent over suprapubic area). There is no rebound, no guarding, no CVA tenderness and no tenderness at McBurney's point.  Genitourinary: Uterus normal. Cervix exhibits no motion tenderness, no discharge and no friability. Right adnexum displays no mass and no tenderness. Left adnexum displays no mass and no tenderness. Vaginal discharge found.  Musculoskeletal: She exhibits  no edema.  Neurological: She is alert and oriented to person, place, and time.  Skin: Skin is warm and dry. Capillary refill takes more than 3 seconds. There is pallor.  Psychiatric: She has a normal mood and affect.  Nursing note and vitals reviewed.  Patient feeling better with fluids, has not thrown up, abd pain subsiding. Improved color.  Results for orders placed or performed in visit on 06/23/17 (from the past 24 hour(s))  POCT  Urinalysis Dipstick (Automated)     Status: Abnormal   Collection Time: 06/23/17 12:17 PM  Result Value Ref Range   Color, UA yellow    Clarity, UA cloudy    Glucose, UA Negative Negative   Bilirubin, UA neg    Ketones, UA neg    Spec Grav, UA 1.025 1.010 - 1.025   Blood, UA moderate    pH, UA 6.0 5.0 - 8.0   Protein, UA Negative Negative   Urobilinogen, UA 0.2 0.2 or 1.0 E.U./dL   Nitrite, UA Negative    Leukocytes, UA Small (1+) (A) Negative  POCT urine pregnancy     Status: Normal   Collection Time: 06/23/17 12:34 PM  Result Value Ref Range   Preg Test, Ur Negative Negative  POCT CBC     Status: Abnormal   Collection Time: 06/23/17 12:49 PM  Result Value Ref Range   WBC 11.3 (A) 4.6 - 10.2 K/uL   Lymph, poc 1.6 0.6 - 3.4   POC LYMPH PERCENT 14.6 10 - 50 %L   MID (cbc) 0.3 0 - 0.9   POC MID % 2.7 0 - 12 %M   POC Granulocyte 9.3 (A) 2 - 6.9   Granulocyte percent 82.7 (A) 37 - 80 %G   RBC 4.39 4.04 - 5.48 M/uL   Hemoglobin 11.4 (A) 12.2 - 16.2 g/dL   HCT, POC 40.9 (A) 81.1 - 47.9 %   MCV 80.6 80 - 97 fL   MCH, POC 26.0 (A) 27 - 31.2 pg   MCHC 32.2 31.8 - 35.4 g/dL   RDW, POC 91.4 %   Platelet Count, POC 368 142 - 424 K/uL   MPV 7.9 0 - 99.8 fL  POCT Microscopic Urinalysis ([facility])     Status: Abnormal   Collection Time: 06/23/17 12:53 PM  Result Value Ref Range   WBC,UR,HPF,POC Too numerous to count  (A) None WBC/hpf   RBC,UR,HPF,POC None None RBC/hpf   Bacteria Many (A) None, Too numerous to count   Mucus Present (A) Absent   Epithelial Cells, UR Per Microscopy None None, Too numerous to count cells/hpf  POCT Wet + KOH Prep     Status: Abnormal   Collection Time: 06/23/17 12:59 PM  Result Value Ref Range   Yeast by KOH Absent Absent   Yeast by wet prep Absent Absent   WBC by wet prep Moderate (A) Few   Clue Cells Wet Prep HPF POC Moderate (A) None   Trich by wet prep Absent Absent   Bacteria Wet Prep HPF POC Few Few   Epithelial Cells By [employer] Pref ([facility]) None  None, Few, Too numerous to count   RBC,UR,HPF,POC None None RBC/hpf     ASSESSMENT and PLAN  1. Nausea and vomiting, intractability of vomiting not specified, unspecified vomiting type Favorable response to IVF and PO challenge. Mild WBC with left shift in setting of urine microscopy elevated WBC and bacteria. Pelvic exam reassuring.  Treating as pyelonephritis, with atypical presentation. New meds r/se/b discussed. RTC precautions reviewed.  -  POCT urine pregnancy - POCT Urinalysis Dipstick (Automated) - POCT CBC - Comprehensive metabolic panel - Lipase - sodium chloride 0.9 % bolus 1,000 mL - POCT Microscopic Urinalysis ([facility]) - POCT Wet + KOH Prep - GC/Chlamydia Probe Amp([facility]) - Urine Culture  2. Lower abdominal pain - POCT Wet + KOH Prep - GC/Chlamydia Probe Amp([facility]) - Urine Culture  3. Pyelonephritis - cefTRIAXone (ROCEPHIN) injection 1 g  4. Bacterial vaginosis - metronidazole 500mg  tablet PO BID  Other orders - omeprazole (PRILOSEC) 40 MG capsule; Take 1 capsule (40 mg total) by mouth daily. - ondansetron (ZOFRAN) 4 MG tablet; Take 1 tablet (4 mg total) by mouth every 8 (eight) hours as needed for nausea or vomiting. - levofloxacin (LEVAQUIN) 750 MG tablet; Take 1 tablet (750 mg total) by mouth daily.  Return in about 2 weeks (around 07/07/2017).    Myles Lipps, MD Primary Care at [facility] [address] [place], [place] [address] Ph.  [phone] Fax [phone]

## 2017-06-23 NOTE — Patient Instructions (Signed)
Pyelonephritis, Adult Pyelonephritis is a kidney infection. The kidneys are organs that help clean your blood by moving waste out of your blood and into your pee (urine). This infection can happen quickly, or it can last for a long time. In most cases, it clears up with treatment and does not cause other problems. Follow these instructions at home: Medicines  Take over-the-counter and prescription medicines only as told by your doctor.  Take your antibiotic medicine as told by your doctor. Do not stop taking the medicine even if you start to feel better. General instructions  Drink enough fluid to keep your pee clear or pale yellow.  Avoid caffeine, tea, and carbonated drinks.  Pee (urinate) often. Avoid holding in pee for long periods of time.  Pee before and after sex.  After pooping (having a bowel movement), women should wipe from front to back. Use each tissue only once.  Keep all follow-up visits as told by your doctor. This is important. Contact a doctor if:  You do not feel better after 2 days.  Your symptoms get worse.  You have a fever. Get help right away if:  You cannot take your medicine or drink fluids as told.  You have chills and shaking.  You throw up (vomit).  You have very bad pain in your side (flank) or back.  You feel very weak or you pass out (faint). This information is not intended to replace advice given to you by your health care provider. Make sure you discuss any questions you have with your health care provider. Document Released: 02/18/2004 Document Revised: 06/18/2015 Document Reviewed: 05/05/2014 Elsevier Interactive Patient Education  2018 Elsevier Inc.  

## 2017-06-24 LAB — COMPREHENSIVE METABOLIC PANEL
ALT: 11 IU/L (ref 0–32)
AST: 17 IU/L (ref 0–40)
Albumin/Globulin Ratio: 1.4 (ref 1.2–2.2)
Albumin: 4.5 g/dL (ref 3.5–5.5)
Alkaline Phosphatase: 80 IU/L (ref 39–117)
BUN/Creatinine Ratio: 10 (ref 9–23)
BUN: 7 mg/dL (ref 6–20)
Bilirubin Total: 0.2 mg/dL (ref 0.0–1.2)
CO2: 20 mmol/L (ref 20–29)
Calcium: 9.6 mg/dL (ref 8.7–10.2)
Chloride: 106 mmol/L (ref 96–106)
Creatinine, Ser: 0.68 mg/dL (ref 0.57–1.00)
GFR calc Af Amer: 137 mL/min/{1.73_m2} (ref >59)
GFR calc non Af Amer: 119 mL/min/{1.73_m2} (ref >59)
Globulin, Total: 3.2 g/dL (ref 1.5–4.5)
Glucose: 85 mg/dL (ref 65–99)
Potassium: 4.1 mmol/L (ref 3.5–5.2)
Sodium: 141 mmol/L (ref 134–144)
Total Protein: 7.7 g/dL (ref 6.0–8.5)

## 2017-06-24 LAB — LIPASE: Lipase: 9 U/L — ABNORMAL LOW (ref 14–72)

## 2017-06-24 LAB — GC/CHLAMYDIA PROBE AMP
Chlamydia trachomatis, NAA: NEGATIVE
Neisseria gonorrhoeae by PCR: NEGATIVE

## 2017-06-25 LAB — URINE CULTURE

## 2017-06-27 NOTE — Progress Notes (Signed)
On appropriate abx, no further action needed

## 2017-07-11 ENCOUNTER — Ambulatory Visit: Payer: 59 | Admitting: Family Medicine

## 2017-07-18 ENCOUNTER — Encounter: Payer: Self-pay | Admitting: Family Medicine

## 2017-07-18 ENCOUNTER — Other Ambulatory Visit: Payer: Self-pay

## 2017-07-18 ENCOUNTER — Ambulatory Visit: Payer: 59 | Admitting: Family Medicine

## 2017-07-18 VITALS — Temp 98.9°F | Ht 62.72 in | Wt 153.8 lb

## 2017-07-18 DIAGNOSIS — R55 Syncope and collapse: Secondary | ICD-10-CM

## 2017-07-18 DIAGNOSIS — R8290 Unspecified abnormal findings in urine: Secondary | ICD-10-CM | POA: Diagnosis not present

## 2017-07-18 LAB — POC MICROSCOPIC URINALYSIS (UMFC): Mucus: ABSENT

## 2017-07-18 LAB — POCT URINALYSIS DIP (MANUAL ENTRY)
Glucose, UA: NEGATIVE mg/dL
Ketones, POC UA: NEGATIVE mg/dL
Leukocytes, UA: NEGATIVE
Nitrite, UA: NEGATIVE
Protein Ur, POC: 100 mg/dL — AB
Spec Grav, UA: 1.02 (ref 1.010–1.025)
Urobilinogen, UA: 4 E.U./dL — AB
pH, UA: 7 (ref 5.0–8.0)

## 2017-07-18 MED ORDER — CEPHALEXIN 500 MG PO CAPS: 500.0000 mg | ORAL_CAPSULE | Freq: Two times a day (BID) | ORAL | 0 refills | Status: DC

## 2017-07-18 NOTE — Progress Notes (Signed)
6/25/20191:35 PM  Jacqueline SettersKristine Bailey [birth year], 32 y.o. female [id number]  Chief Complaint  Patient presents with  . Follow-up    pyelonephritis  . Dizziness    started Sunday afternoon [facility], became light headed and dizzy. feeling comes and goes    HPI:   Patient is a 32 y.o. female who presents today for feelings of lightheaded  She was in her normal state of health until Sunday. [facility] and when she arrived home she felt a heat rush, lightheaded and dizzy as if she was going to pass out.  This has happend twice more, in the mornings since then She has not LOC  She reports previous episodes in 2017 - diagnosed with pseudoseizure as she did have some body jerking at that time related to syncopal events at work. During those episodes she was also found to have UTIs She has h/o panic attacks, but these feel different, she does not get SOB nor clammy/sweaty palms.  She denies any chest pain, palpitations, SOB, focal weakness, unilateral vision changes, headaches, vomiting  Fall Risk  07/18/2017  Falls in the past year? No     Depression screen PHQ 2/9 07/18/2017  Decreased Interest 0  Down, Depressed, Hopeless 0  PHQ - 2 Score 0    No Known Allergies  Prior to Admission medications   Medication Sig Start Date End Date Taking? Authorizing Provider  omeprazole (PRILOSEC) 40 MG capsule Take 1 capsule (40 mg total) by mouth daily. 06/23/17  Yes Myles LippsSantiago, Izayiah Tibbitts M, MD    History reviewed. No pertinent past medical history.  Past Surgical History:  Procedure Laterality Date  . CESAREAN SECTION N/A 05/16/2012   Procedure: CESAREAN SECTION;  Surgeon: Tresa EndoKelly A. Ernestina PennaFogleman, MD;  Location: [facility];  Service: Obstetrics;  Laterality: N/A;    Social History   Tobacco Use  . Smoking status: Former Smoker    Last attempt to quit: 05/15/2011    Years since quitting: 6.1  . Smokeless tobacco: Never Used  Substance Use Topics  . Alcohol use: No     Family History  Problem Relation Age of Onset  . Hypertension Mother   . Hyperlipidemia Mother   . Cancer Mother   . Hyperlipidemia Father   . Hypertension Father     ROS Per hpi  OBJECTIVE:  Temperature 98.9 F (37.2 C), temperature source Oral, height 5' 2.72" (1.593 m), weight 153 lb 12.8 oz (69.8 kg), last menstrual period 07/17/2017, SpO2 98 %.  Lying: 126/78, 86 Sitting: 130/82, 88 Standing: 138/90, 83  Physical Exam  Constitutional: She is oriented to person, place, and time. She appears well-developed and well-nourished.  HENT:  Head: Normocephalic and atraumatic.  Mouth/Throat: Oropharynx is clear and moist. No oropharyngeal exudate.  Eyes: Pupils are equal, round, and reactive to light. EOM are normal. No scleral icterus.  Neck: Neck supple. No thyromegaly present.  Cardiovascular: Normal rate, regular rhythm, normal heart sounds and normal pulses. Exam reveals no gallop and no friction rub.  No murmur heard. Occasional irregular beat is heard  Pulmonary/Chest: Effort normal and breath sounds normal. She has no wheezes. She has no rales.  Musculoskeletal: She exhibits no edema.  Neurological: She is alert and oriented to person, place, and time. She has normal strength. She displays normal reflexes. No cranial nerve deficit. Coordination normal.  Skin: Skin is warm and dry.  Psychiatric: She has a normal mood and affect.  Nursing note and vitals reviewed.  Results for orders placed or performed in visit on 07/18/17 (from the past 24 hour(s))  POCT urinalysis dipstick     Status: Abnormal   Collection Time: 07/18/17  2:04 PM  Result Value Ref Range   Color, UA orange (A) yellow   Clarity, UA cloudy (A) clear   Glucose, UA negative negative mg/dL   Bilirubin, UA small (A) negative   Ketones, POC UA negative negative mg/dL   Spec Grav, UA 1.610 9.604 - 1.025   Blood, UA large (A) negative   pH, UA 7.0 5.0 - 8.0   Protein Ur, POC =100 (A) negative  mg/dL   Urobilinogen, UA 4.0 (A) 0.2 or 1.0 E.U./dL   Nitrite, UA Negative Negative   Leukocytes, UA Negative Negative  POCT Microscopic Urinalysis ([facility])     Status: Abnormal   Collection Time: 07/18/17  2:36 PM  Result Value Ref Range   WBC,UR,HPF,POC Many (A) None WBC/hpf   RBC,UR,HPF,POC Many (A) None RBC/hpf   Bacteria Moderate (A) None, Too numerous to count   Mucus Absent Absent   Epithelial Cells, UR Per Microscopy Moderate (A) None, Too numerous to count cells/hpf    My interpretation of EKG:  NSR, HR 85, normal intervals, no ST changes  ASSESSMENT and PLAN  1. Pre-syncope Suggestive of vasovagal episodes, discussed increase fluids, salt intake. Use of OTC compression stockings. Consider cards referral. RTC precautions reviewed. - POCT urinalysis dipstick - POCT Microscopic Urinalysis ([facility]) - CBC with Differential/Platelet - Comprehensive metabolic panel - EKG 12-Lead  2. Urine findings abnormal Urine microscopy with WBC and bacteria, treating empirically. Is vasovagal episodes related to UTI? - urine culture - keflex 500mg  BID  Return in about 3 months (around 10/18/2017) for pap.    Myles Lipps, MD Primary Care at [facility] [address] [place], [place] [address] Ph.  [phone] Fax [phone]

## 2017-07-18 NOTE — Patient Instructions (Addendum)
   IF you received an x-ray today, you will receive an invoice from [facility]. Please contact [facility] at [phone] with questions or concerns regarding your invoice.   IF you received labwork today, you will receive an invoice from [facility]. Please contact [facility] at [phone] with questions or concerns regarding your invoice.   Our billing staff will not be able to assist you with questions regarding bills from these companies.  You will be contacted with the lab results as soon as they are available. The fastest way to get your results is to activate your My Chart account. Instructions are located on the last page of this paperwork. If you have not heard from us regarding the results in 2 weeks, please contact this office.      Vasovagal Syncope, Adult Syncope, which is commonly known as fainting or passing out, is a temporary loss of consciousness. It occurs when the blood flow to the brain is reduced. Vasovagal syncope, also called neurocardiogenic syncope, is a fainting spell that happens when blood flow to the brain is reduced because of a sudden drop in heart rate and blood pressure. Vasovagal syncope is usually harmless. However, you can get injured if you fall during a fainting spell. What are the causes? This condition is caused by a drop in heart rate and blood pressure, usually in response to a trigger. Many things and situations can trigger an episode, including:  Pain.  Fear.  The sight of blood. This may occur during medical procedures, such as when blood is being drawn from a vein.  Common activities, such as coughing, swallowing, stretching, or going to the bathroom.  Emotional stress.  Being in a confined space.  Prolonged standing, especially in a warm environment.  Lack of sleep or rest.  Not eating for a long time.  Not drinking enough liquids.  Recent illness.  Drinking alcohol.  Taking drugs that affect blood  pressure, such as marijuana, cocaine, opiates, or inhalants.  What are the signs or symptoms? Before a fainting episode, you may:  Feel dizzy or light-headed.  Become pale.  Sense that you are going to faint.  Feel like the room is spinning.  Only see directly ahead (tunnel vision).  Feel sick to your stomach (nauseous).  See spots.  Slowly lose vision.  Hear ringing in your ears.  Have a headache.  Feel warm and sweaty.  Feel a sensation of pins and needles.  During the fainting spell, you may twitch or make jerky movements. Fainting spells usually last no longer than a few minutes before you wake up. If you get up too quickly before your body can recover, you may faint again. How is this diagnosed? This condition is diagnosed based on your symptoms, your medical history, and a physical exam. Tests may be done to rule out other causes of fainting. Tests may include:  Blood tests.  Heart tests, such as an electrocardiogram (ECG), echocardiogram, or electrophysiology study.  A test to check your response to changes in position (tilt table test).  How is this treated? Usually, treatment is not needed for this condition. Your health care provider may suggest ways to help prevent fainting episodes. These may include:  Drinking additional fluids if you are exposed to a trigger.  Sitting or lying down if you notice signs that an episode is coming.  If your fainting spells continue, your health care provider may recommend that you:  Take medicines to prevent fainting or to help reduce   episodes of fainting.  Do certain exercises.  Wear compression stockings.  Have surgery to place a pacemaker in your body (rare).  Follow these instructions at home:  Learn to identify the signs that an episode is coming.  Sit or lie down at the first sign of a fainting spell. If you sit down, put your head down between your legs. If you lie down, swing your legs up in the air to  increase blood flow to the brain.  Avoid hot tubs and saunas.  Avoid standing for a long time. If you have to stand for a long time, try: ? Crossing your legs. ? Flexing and stretching your leg muscles. ? Squatting. ? Moving your legs. ? Bending over.  Drink enough fluid to keep your urine clear or pale yellow.  Make changes to your diet that your health care provider recommends. You may be told to: ? Avoid caffeine. ? Eat more salt.  Take over-the-counter and prescription medicines only as told by your health care provider. Contact a health care provider if:  You continue to have fainting spells despite treatment.  You faint more often despite treatment.  You lose consciousness for more than a few minutes.  You faint during or after exercising or after being startled.  You have twitching or jerky movements for longer than a few seconds during a fainting spell.  You have an episode of twitching or jerky movements without fainting. Get help right away if:  A fainting spell leads to an injury or bleeding.  You have new symptoms that occur with the fainting spells, such as: ? Shortness of breath. ? Chest pain. ? Irregular heartbeat.  You twitch or make jerky movements for more than 5 minutes.  You twitch or make jerky movements during more than one fainting spell. This information is not intended to replace advice given to you by your health care provider. Make sure you discuss any questions you have with your health care provider. Document Released: 12/28/2011 Document Revised: 06/24/2015 Document Reviewed: 11/08/2014 Elsevier Interactive Patient Education  [employer]2018 [employer] Inc[employer]

## 2017-07-19 LAB — CBC WITH DIFFERENTIAL/PLATELET
Basophils Absolute: 0 10*3/uL (ref 0.0–0.2)
Basos: 0 %
EOS (ABSOLUTE): 0.1 10*3/uL (ref 0.0–0.4)
Eos: 2 %
Hematocrit: 36.9 % (ref 34.0–46.6)
Hemoglobin: 11.2 g/dL (ref 11.1–15.9)
Immature Grans (Abs): 0 10*3/uL (ref 0.0–0.1)
Immature Granulocytes: 0 %
Lymphocytes Absolute: 2.3 10*3/uL (ref 0.7–3.1)
Lymphs: 27 %
MCH: 25.9 pg — ABNORMAL LOW (ref 26.6–33.0)
MCHC: 30.4 g/dL — ABNORMAL LOW (ref 31.5–35.7)
MCV: 85 fL (ref 79–97)
Monocytes Absolute: 0.6 10*3/uL (ref 0.1–0.9)
Monocytes: 7 %
Neutrophils Absolute: 5.5 10*3/uL (ref 1.4–7.0)
Neutrophils: 64 %
Platelets: 403 10*3/uL (ref 150–450)
RBC: 4.33 x10E6/uL (ref 3.77–5.28)
RDW: 16.1 % — ABNORMAL HIGH (ref 12.3–15.4)
WBC: 8.5 10*3/uL (ref 3.4–10.8)

## 2017-07-19 LAB — COMPREHENSIVE METABOLIC PANEL
ALT: 12 IU/L (ref 0–32)
AST: 17 IU/L (ref 0–40)
Albumin/Globulin Ratio: 1.4 (ref 1.2–2.2)
Albumin: 4.4 g/dL (ref 3.5–5.5)
Alkaline Phosphatase: 81 IU/L (ref 39–117)
BUN/Creatinine Ratio: 11 (ref 9–23)
BUN: 7 mg/dL (ref 6–20)
Bilirubin Total: 0.3 mg/dL (ref 0.0–1.2)
CO2: 22 mmol/L (ref 20–29)
Calcium: 9.5 mg/dL (ref 8.7–10.2)
Chloride: 103 mmol/L (ref 96–106)
Creatinine, Ser: 0.65 mg/dL (ref 0.57–1.00)
GFR calc Af Amer: 139 mL/min/{1.73_m2} (ref >59)
GFR calc non Af Amer: 120 mL/min/{1.73_m2} (ref >59)
Globulin, Total: 3.1 g/dL (ref 1.5–4.5)
Glucose: 87 mg/dL (ref 65–99)
Potassium: 4.4 mmol/L (ref 3.5–5.2)
Sodium: 137 mmol/L (ref 134–144)
Total Protein: 7.5 g/dL (ref 6.0–8.5)

## 2017-07-26 ENCOUNTER — Telehealth: Payer: Self-pay | Admitting: Family Medicine

## 2017-07-26 NOTE — Telephone Encounter (Signed)
Pt returned call to lab results and any recommendations. Pt voice understanding. She asked about an antibiotic for pyelonephritis. Told her that ke[facility] Pt voiced understanding and she will go get today. There was not a result note, so a telephone encounter was created.

## 2018-03-22 DIAGNOSIS — R109 Unspecified abdominal pain: Secondary | ICD-10-CM | POA: Diagnosis not present

## 2018-03-22 DIAGNOSIS — R112 Nausea with vomiting, unspecified: Secondary | ICD-10-CM | POA: Diagnosis not present

## 2018-04-06 DIAGNOSIS — R030 Elevated blood-pressure reading, without diagnosis of hypertension: Secondary | ICD-10-CM | POA: Diagnosis not present

## 2019-09-01 ENCOUNTER — Other Ambulatory Visit: Payer: Self-pay

## 2019-09-01 ENCOUNTER — Emergency Department (HOSPITAL_COMMUNITY): Payer: 59

## 2019-09-01 ENCOUNTER — Emergency Department (HOSPITAL_COMMUNITY)
Admission: EM | Admit: 2019-09-01 | Discharge: 2019-09-02 | Disposition: A | Discharge status: Home / Self Care | Payer: 59 | Attending: Emergency Medicine | Admitting: Emergency Medicine

## 2019-09-01 ENCOUNTER — Encounter (HOSPITAL_COMMUNITY): Payer: Self-pay | Admitting: Emergency Medicine

## 2019-09-01 DIAGNOSIS — R0602 Shortness of breath: Secondary | ICD-10-CM | POA: Insufficient documentation

## 2019-09-01 DIAGNOSIS — R0789 Other chest pain: Secondary | ICD-10-CM | POA: Diagnosis present

## 2019-09-01 DIAGNOSIS — Z5321 Procedure and treatment not carried out due to patient leaving prior to being seen by health care provider: Secondary | ICD-10-CM | POA: Diagnosis not present

## 2019-09-01 DIAGNOSIS — R42 Dizziness and giddiness: Secondary | ICD-10-CM | POA: Diagnosis not present

## 2019-09-01 MED ORDER — SODIUM CHLORIDE 0.9% FLUSH: 3.0000 mL | Freq: Once | INTRAVENOUS | Status: DC

## 2019-09-01 NOTE — ED Triage Notes (Signed)
Patient arrived with EMS from home reports left chest pain non radiating this evening with SOB , no emesis or diaphoresis , mild dizziness/lightheaded , denies cough or fever , she received ASA 324 mg and 1 NTG sl prior to arrival .

## 2019-09-02 ENCOUNTER — Ambulatory Visit
Admission: EM | Admit: 2019-09-02 | Discharge: 2019-09-02 | Disposition: A | Discharge status: Home / Self Care | Payer: 59 | Attending: Emergency Medicine | Admitting: Emergency Medicine

## 2019-09-02 DIAGNOSIS — R079 Chest pain, unspecified: Secondary | ICD-10-CM

## 2019-09-02 LAB — TROPONIN I (HIGH SENSITIVITY): Troponin I (High Sensitivity): 3 ng/L (ref <18)

## 2019-09-02 LAB — CBC
HCT: 33.5 % — ABNORMAL LOW (ref 36.0–46.0)
Hemoglobin: 10.1 g/dL — ABNORMAL LOW (ref 12.0–15.0)
MCH: 24.3 pg — ABNORMAL LOW (ref 26.0–34.0)
MCHC: 30.1 g/dL (ref 30.0–36.0)
MCV: 80.5 fL (ref 80.0–100.0)
Platelets: 348 10*3/uL (ref 150–400)
RBC: 4.16 MIL/uL (ref 3.87–5.11)
RDW: 15.9 % — ABNORMAL HIGH (ref 11.5–15.5)
WBC: 9.2 10*3/uL (ref 4.0–10.5)
nRBC: 0 % (ref 0.0–0.2)

## 2019-09-02 LAB — I-STAT BETA HCG BLOOD, ED (MC, WL, AP ONLY): I-stat hCG, quantitative: <5 m[IU]/mL (ref <5)

## 2019-09-02 LAB — BASIC METABOLIC PANEL
Anion gap: 11 (ref 5–15)
BUN: 8 mg/dL (ref 6–20)
CO2: 20 mmol/L — ABNORMAL LOW (ref 22–32)
Calcium: 9.4 mg/dL (ref 8.9–10.3)
Chloride: 104 mmol/L (ref 98–111)
Creatinine, Ser: 0.64 mg/dL (ref 0.44–1.00)
GFR calc Af Amer: >60 mL/min (ref >60)
GFR calc non Af Amer: >60 mL/min (ref >60)
Glucose, Bld: 116 mg/dL — ABNORMAL HIGH (ref 70–99)
Potassium: 3.6 mmol/L (ref 3.5–5.1)
Sodium: 135 mmol/L (ref 135–145)

## 2019-09-02 LAB — PROTIME-INR
INR: 1.1 (ref 0.8–1.2)
Prothrombin Time: 13.5 seconds (ref 11.4–15.2)

## 2019-09-02 MED ORDER — LIDOCAINE VISCOUS HCL 2 % MT SOLN
15.0000 mL | Freq: Once | OROMUCOSAL | Status: AC
  Administered 2019-09-02: 15 mL via ORAL

## 2019-09-02 MED ORDER — PANTOPRAZOLE SODIUM 20 MG PO TBEC
20.0000 mg | DELAYED_RELEASE_TABLET | Freq: Every day | ORAL | 0 refills | Status: AC
Start: 2019-09-02 — End: ?

## 2019-09-02 MED ORDER — HYDROXYZINE HCL 25 MG PO TABS
25.0000 mg | ORAL_TABLET | Freq: Four times a day (QID) | ORAL | 0 refills | Status: AC
Start: 2019-09-02 — End: ?

## 2019-09-02 MED ORDER — ALUM & MAG HYDROXIDE-SIMETH 200-200-20 MG/5ML PO SUSP
30.0000 mL | Freq: Once | ORAL | Status: AC
  Administered 2019-09-02: 30 mL via ORAL

## 2019-09-02 NOTE — Discharge Instructions (Addendum)
Go to ER for worsening chest pain, tightness, difficulty breathing, nausea, vomiting, weakness, dizziness, or you pass out.

## 2019-09-02 NOTE — ED Provider Notes (Signed)
[facility]    CSN: [id number] Arrival date & time: 09/02/19  1327      History   Chief Complaint Chief Complaint  Patient presents with  . Shortness of Breath    HPI Jacqueline Bailey is a 34 y.o. female presented for chest tightness, difficulty breathing, tingling left fingertip since last night.  Patient went to EMS around 10:30 PM last night.  Please see those records reviewed me at time of visit.  Underwent EKG, CXR which were WNL.  Since then, has persisted.  Left AMA as she did not feel like waiting.  No nausea, vomiting, chest pain, palpitations, lightheadedness or syncopal event.  Does admit to history of anxiety: No SI/HI.    History reviewed. No pertinent past medical history.  Patient Active Problem List   Diagnosis Date Noted  . Cesarean delivery delivered 05/17/2012  . Postpartum care following cesarean delivery (4/23) 05/17/2012  . Anxiety 03/04/2011    Past Surgical History:  Procedure Laterality Date  . CESAREAN SECTION N/A 05/16/2012   Procedure: CESAREAN SECTION;  Surgeon: Tresa Endo A. Ernestina Penna, MD;  Location: [facility];  Service: Obstetrics;  Laterality: N/A;    OB History    Gravida  1   Para  1   Term  1   Preterm      AB      Living  1     SAB      TAB      Ectopic      Multiple      Live Births  1            Home Medications    Prior to Admission medications   Medication Sig Start Date End Date Taking? Authorizing Provider  hydrOXYzine (ATARAX/VISTARIL) 25 MG tablet Take 1 tablet (25 mg total) by mouth every 6 (six) hours. 09/02/19   Hall-Potvin, [place], PA-C  pantoprazole (PROTONIX) 20 MG tablet Take 1 tablet (20 mg total) by mouth daily. 09/02/19   Hall-Potvin, [place], PA-C    Family History Family History  Problem Relation Age of Onset  . Hypertension Mother   . Hyperlipidemia Mother   . Cancer Mother   . Hyperlipidemia Father   . Hypertension Father     Social History Social History   Tobacco Use  .  Smoking status: Former Smoker    Quit date: 05/15/2011    Years since quitting: 8.3  . Smokeless tobacco: Never Used  Substance Use Topics  . Alcohol use: No  . Drug use: No     Allergies   Patient has no known allergies.   Review of Systems As per HPI   Physical Exam Triage Vital Signs ED Triage Vitals  Enc Vitals Group     BP 09/02/19 1506 130/88     Pulse Rate 09/02/19 1506 90     Resp 09/02/19 1506 16     Temp 09/02/19 1506 98.5 F (36.9 C)     Temp Source 09/02/19 1506 Oral     SpO2 09/02/19 1506 100 %     Weight --      Height --      Head Circumference --      Peak Flow --      Pain Score 09/02/19 1516 7     Pain Loc --      Pain Edu? --      Excl. in GC? --    No data found.  Updated Vital Signs BP 130/88 (BP Location: Left Arm)  Pulse 90   Temp 98.5 F (36.9 C) (Oral)   Resp 16   LMP 08/08/2019   SpO2 100%   Breastfeeding No   Visual Acuity Right Eye Distance:   Left Eye Distance:   Bilateral Distance:    Right Eye Near:   Left Eye Near:    Bilateral Near:     Physical Exam Vitals reviewed.  Constitutional:      General: She is not in acute distress.    Appearance: She is normal weight. She is not ill-appearing.  HENT:     Head: Normocephalic and atraumatic.  Eyes:     General: No scleral icterus.       Right eye: No discharge.        Left eye: No discharge.     Extraocular Movements: Extraocular movements intact.     Pupils: Pupils are equal, round, and reactive to light.  Neck:     Comments: Trachea midline, negative JVD Cardiovascular:     Rate and Rhythm: Normal rate and regular rhythm.     Pulses: Normal pulses.     Heart sounds: No murmur heard.   Pulmonary:     Effort: Pulmonary effort is normal. No respiratory distress.     Breath sounds: No wheezing.  Chest:     Chest wall: No tenderness.  Abdominal:     General: Abdomen is flat.     Palpations: Abdomen is soft.     Tenderness: There is no abdominal tenderness.  There is no guarding.  Musculoskeletal:     Cervical back: Normal range of motion and neck supple. No muscular tenderness.  Lymphadenopathy:     Cervical: No cervical adenopathy.  Skin:    General: Skin is warm.     Capillary Refill: Capillary refill takes less than 2 seconds.     Coloration: Skin is not jaundiced or pale.     Findings: No rash.  Neurological:     General: No focal deficit present.     Mental Status: She is alert and oriented to person, place, and time.  Psychiatric:        Mood and Affect: Mood normal.        Thought Content: Thought content normal.      UC Treatments / Results  Labs (all labs ordered are listed, but only abnormal results are displayed) Labs Reviewed - No data to display  EKG   Radiology DG Chest 2 View  Result Date: 09/01/2019 CLINICAL DATA:  Chest pain EXAM: CHEST - 2 VIEW COMPARISON:  01/16/2013 FINDINGS: The heart size and mediastinal contours are within normal limits. Both lungs are clear. The visualized skeletal structures are unremarkable. IMPRESSION: Negative. Electronically Signed   By: Charlett Nose M.D.   On: 09/01/2019 23:53    Procedures Procedures (including critical care time)  Medications Ordered in UC Medications  alum & mag hydroxide-simeth (MAALOX/MYLANTA) 200-200-20 MG/5ML suspension 30 mL (30 mLs Oral Given 09/02/19 1614)    And  lidocaine (XYLOCAINE) 2 % viscous mouth solution 15 mL (15 mLs Oral Given 09/02/19 1613)    Initial Impression / Assessment and Plan / UC Course  I have reviewed the triage vital signs and the nursing notes.  Pertinent labs & imaging results that were available during my care of the patient were reviewed by me and considered in my medical decision making (see chart for details).     Patient afebrile, nontoxic, hemodynamically stable in office.  Records reviewed showing normal CXR, EKG, negative troponin.  EKG done office, reviewed by me and compared to previous from ER yesterday: NSR with  ventricular 84 bpm.  No QTC prolongation, ST elevation or depression.  Waveforms stable in all leads: Nonacute EKG.  No need for further intervention at this time.  Will trial Protonix given history of GERD, hydroxyzine for anxiety.  ER return precautions discussed, pt verbalized understanding and is agreeable to plan. Final Clinical Impressions(s) / UC Diagnoses   Final diagnoses:  Chest pain, unspecified type     Discharge Instructions     Go to ER for worsening chest pain, tightness, difficulty breathing, nausea, vomiting, weakness, dizziness, or you pass out.    ED Prescriptions    Medication Sig Dispense Auth. Provider   pantoprazole (PROTONIX) 20 MG tablet Take 1 tablet (20 mg total) by mouth daily. 30 tablet Hall-Potvin, [place], PA-C   hydrOXYzine (ATARAX/VISTARIL) 25 MG tablet Take 1 tablet (25 mg total) by mouth every 6 (six) hours. 12 tablet Hall-Potvin, [place], PA-C     PDMP not reviewed this encounter.   Hall-Potvin, [place], [place] 09/02/19 1701

## 2019-09-02 NOTE — ED Triage Notes (Signed)
Pt c/o chest tightness, SOB, and tingling to lt finger tips since last night. States EMS took her to ED and she left AMA after feeling better. States the chest tightness returned today causing SOB. No distress noted. Pt speaking in complete sentences. States had a EKG, chest x-ray, and blood work done last night in the ED.

## 2019-09-02 NOTE — ED Notes (Signed)
Removed IV put in place by EMS, pt states she is just going to her PCP in the morning

## 2019-09-12 ENCOUNTER — Other Ambulatory Visit: Payer: Self-pay

## 2019-09-12 ENCOUNTER — Emergency Department (HOSPITAL_COMMUNITY): Payer: 59

## 2019-09-12 ENCOUNTER — Emergency Department (HOSPITAL_COMMUNITY)
Admission: EM | Admit: 2019-09-12 | Discharge: 2019-09-12 | Disposition: A | Discharge status: Home / Self Care | Payer: 59 | Attending: Emergency Medicine | Admitting: Emergency Medicine

## 2019-09-12 DIAGNOSIS — R0602 Shortness of breath: Secondary | ICD-10-CM | POA: Insufficient documentation

## 2019-09-12 DIAGNOSIS — R0789 Other chest pain: Secondary | ICD-10-CM | POA: Diagnosis not present

## 2019-09-12 DIAGNOSIS — Z87891 Personal history of nicotine dependence: Secondary | ICD-10-CM | POA: Insufficient documentation

## 2019-09-12 DIAGNOSIS — R002 Palpitations: Secondary | ICD-10-CM | POA: Insufficient documentation

## 2019-09-12 DIAGNOSIS — R079 Chest pain, unspecified: Secondary | ICD-10-CM

## 2019-09-12 LAB — BASIC METABOLIC PANEL
Anion gap: 12 (ref 5–15)
BUN: 10 mg/dL (ref 6–20)
CO2: 21 mmol/L — ABNORMAL LOW (ref 22–32)
Calcium: 9.7 mg/dL (ref 8.9–10.3)
Chloride: 105 mmol/L (ref 98–111)
Creatinine, Ser: 0.68 mg/dL (ref 0.44–1.00)
GFR calc Af Amer: >60 mL/min (ref >60)
GFR calc non Af Amer: >60 mL/min (ref >60)
Glucose, Bld: 98 mg/dL (ref 70–99)
Potassium: 3.4 mmol/L — ABNORMAL LOW (ref 3.5–5.1)
Sodium: 138 mmol/L (ref 135–145)

## 2019-09-12 LAB — CBC
HCT: 35 % — ABNORMAL LOW (ref 36.0–46.0)
Hemoglobin: 10.4 g/dL — ABNORMAL LOW (ref 12.0–15.0)
MCH: 24.1 pg — ABNORMAL LOW (ref 26.0–34.0)
MCHC: 29.7 g/dL — ABNORMAL LOW (ref 30.0–36.0)
MCV: 81 fL (ref 80.0–100.0)
Platelets: 415 10*3/uL — ABNORMAL HIGH (ref 150–400)
RBC: 4.32 MIL/uL (ref 3.87–5.11)
RDW: 16.3 % — ABNORMAL HIGH (ref 11.5–15.5)
WBC: 9.2 10*3/uL (ref 4.0–10.5)
nRBC: 0 % (ref 0.0–0.2)

## 2019-09-12 LAB — D-DIMER, QUANTITATIVE: D-Dimer, Quant: 0.95 ug/mL-FEU — ABNORMAL HIGH (ref 0.00–0.50)

## 2019-09-12 LAB — TROPONIN I (HIGH SENSITIVITY)
Troponin I (High Sensitivity): 3 ng/L (ref <18)
Troponin I (High Sensitivity): <2 ng/L (ref <18)

## 2019-09-12 LAB — I-STAT BETA HCG BLOOD, ED (MC, WL, AP ONLY): I-stat hCG, quantitative: <5 m[IU]/mL (ref <5)

## 2019-09-12 MED ORDER — SODIUM CHLORIDE 0.9 % IV BOLUS
1000.0000 mL | Freq: Once | INTRAVENOUS | Status: AC
  Administered 2019-09-12: 1000 mL via INTRAVENOUS

## 2019-09-12 MED ORDER — POTASSIUM CHLORIDE CRYS ER 20 MEQ PO TBCR
40.0000 meq | EXTENDED_RELEASE_TABLET | Freq: Once | ORAL | Status: AC
  Administered 2019-09-12: 40 meq via ORAL
  Filled 2019-09-12: qty 2

## 2019-09-12 MED ORDER — IOHEXOL 350 MG/ML SOLN
65.0000 mL | Freq: Once | INTRAVENOUS | Status: AC | PRN
  Administered 2019-09-12: 65 mL via INTRAVENOUS

## 2019-09-12 NOTE — ED Notes (Signed)
Pt returned from CT scanner

## 2019-09-12 NOTE — ED Provider Notes (Signed)
[facility] EMERGENCY DEPARTMENT Provider Note   CSN: [id number] Arrival date & time: 09/12/19  1151     History Chief Complaint  Patient presents with  . Chest Pain    Jacqueline Bailey is a 34 y.o. female.  HPI 34 year old female presents with chest pain.  Started today around 10 AM while she was on the couch.  She first noticed palpitations and rapid heartbeat.  Then had chest tightness/pressure and burning in her chest.  Feels short of breath as well.  Has happened on and off over the last couple weeks.  Came to the ER one time but the wait was too long and she did not get formally evaluated.  Did go to urgent care.  She has a history of anxiety but usually this is provoked by something specific.  Currently this chest tightness is coming and going since onset today.  No leg swelling or pain.  No recent travel or birth control pills.   No past medical history on file.  Patient Active Problem List   Diagnosis Date Noted  . Cesarean delivery delivered 05/17/2012  . Postpartum care following cesarean delivery (4/23) 05/17/2012  . Anxiety 03/04/2011    Past Surgical History:  Procedure Laterality Date  . CESAREAN SECTION N/A 05/16/2012   Procedure: CESAREAN SECTION;  Surgeon: Tresa Endo A. Ernestina Penna, MD;  Location: [facility];  Service: Obstetrics;  Laterality: N/A;     OB History    Gravida  1   Para  1   Term  1   Preterm      AB      Living  1     SAB      TAB      Ectopic      Multiple      Live Births  1           Family History  Problem Relation Age of Onset  . Hypertension Mother   . Hyperlipidemia Mother   . Cancer Mother   . Hyperlipidemia Father   . Hypertension Father     Social History   Tobacco Use  . Smoking status: Former Smoker    Quit date: 05/15/2011    Years since quitting: 8.3  . Smokeless tobacco: Never Used  Substance Use Topics  . Alcohol use: No  . Drug use: No    Home Medications Prior to Admission  medications   Medication Sig Start Date End Date Taking? Authorizing Provider  hydrOXYzine (ATARAX/VISTARIL) 25 MG tablet Take 1 tablet (25 mg total) by mouth every 6 (six) hours. 09/02/19   Hall-Potvin, [place], PA-C  pantoprazole (PROTONIX) 20 MG tablet Take 1 tablet (20 mg total) by mouth daily. 09/02/19   Hall-Potvin, [place], PA-C    Allergies    Patient has no known allergies.  Review of Systems   Review of Systems  Respiratory: Positive for chest tightness and shortness of breath. Negative for cough.   Cardiovascular: Positive for chest pain and palpitations. Negative for leg swelling.  Gastrointestinal: Negative for abdominal pain.  All other systems reviewed and are negative.   Physical Exam Updated Vital Signs BP 122/80   Pulse 87   Temp 98.3 F (36.8 C) (Oral)   Resp 16   Ht 5\' 1"  (1.549 m)   Wt 63 kg   SpO2 100%   BMI 26.26 kg/m   Physical Exam Vitals and nursing note reviewed.  Constitutional:      General: She is not in acute distress.  Appearance: She is well-developed. She is not ill-appearing or diaphoretic.  HENT:     Head: Normocephalic and atraumatic.     Right Ear: External ear normal.     Left Ear: External ear normal.     Nose: Nose normal.  Eyes:     General:        Right eye: No discharge.        Left eye: No discharge.  Cardiovascular:     Rate and Rhythm: Regular rhythm. Tachycardia present.     Heart sounds: Normal heart sounds. No murmur heard.   Pulmonary:     Effort: Pulmonary effort is normal.     Breath sounds: Normal breath sounds.  Abdominal:     Palpations: Abdomen is soft.     Tenderness: There is no abdominal tenderness.  Skin:    General: Skin is warm and dry.  Neurological:     Mental Status: She is alert.  Psychiatric:        Mood and Affect: Mood is not anxious.     ED Results / Procedures / Treatments   Labs (all labs ordered are listed, but only abnormal results are displayed) Labs Reviewed  BASIC METABOLIC  PANEL - Abnormal; Notable for the following components:      Result Value   Potassium 3.4 (*)    CO2 21 (*)    All other components within normal limits  CBC - Abnormal; Notable for the following components:   Hemoglobin 10.4 (*)    HCT 35.0 (*)    MCH 24.1 (*)    MCHC 29.7 (*)    RDW 16.3 (*)    Platelets 415 (*)    All other components within normal limits  D-DIMER, QUANTITATIVE (NOT AT [facility]) - Abnormal; Notable for the following components:   D-Dimer, Quant 0.95 (*)    All other components within normal limits  I-STAT BETA HCG BLOOD, ED ([facility], [facility], [facility] ONLY)  TROPONIN I (HIGH SENSITIVITY)  TROPONIN I (HIGH SENSITIVITY)    EKG None  Radiology DG Chest 2 View  Result Date: 09/12/2019 CLINICAL DATA:  Chest pain. EXAM: CHEST - 2 VIEW COMPARISON:  Chest radiograph 09/01/2019 FINDINGS: The heart size and mediastinal contours are within normal limits. Both lungs are clear. The visualized skeletal structures are unremarkable. IMPRESSION: No acute cardiopulmonary disease. Electronically Signed   By: Feliberto Harts MD   On: 09/12/2019 12:53   CT Angio Chest PE W and/or Wo Contrast  Result Date: 09/12/2019 CLINICAL DATA:  Chest pain elevated D-dimer EXAM: CT ANGIOGRAPHY CHEST WITH CONTRAST TECHNIQUE: Multidetector CT imaging of the chest was performed using the standard protocol during bolus administration of intravenous contrast. Multiplanar CT image reconstructions and MIPs were obtained to evaluate the vascular anatomy. CONTRAST:  30mL OMNIPAQUE IOHEXOL 350 MG/ML SOLN COMPARISON:  Chest x-ray 09/12/2019 FINDINGS: Cardiovascular: Satisfactory opacification of the pulmonary arteries to the segmental level. No evidence of pulmonary embolism. Normal heart size. No pericardial effusion. Nonaneurysmal aorta. No dissection is seen. Mediastinum/Nodes: No enlarged mediastinal, hilar, or axillary lymph nodes. Thyroid gland, trachea, and esophagus demonstrate no significant findings. Lungs/Pleura:  Lungs are clear. No pleural effusion or pneumothorax. Upper Abdomen: No acute abnormality. Musculoskeletal: No chest wall abnormality. No acute or significant osseous findings. Review of the MIP images confirms the above findings. IMPRESSION: Negative. No CT evidence for acute pulmonary embolus or aortic dissection. Clear lung fields. Electronically Signed   By: Jasmine Pang M.D.   On: 09/12/2019 20:32  Procedures Procedures (including critical care time)  Medications Ordered in ED Medications  sodium chloride 0.9 % bolus 1,000 mL (0 mLs Intravenous Stopped 09/12/19 2033)  potassium chloride SA (KLOR-CON) CR tablet 40 mEq (40 mEq Oral Given 09/12/19 1813)  iohexol (OMNIPAQUE) 350 MG/ML injection 65 mL (65 mLs Intravenous Contrast Given 09/12/19 2024)    ED Course  I have reviewed the triage vital signs and the nursing notes.  Pertinent labs & imaging results that were available during my care of the patient were reviewed by me and considered in my medical decision making (see chart for details).    MDM Rules/Calculators/A&P                          Given patient is having tachycardia with no other clear etiology she does not look particularly anxious, work-up for otherwise low risk PE obtained.  D-dimer is positive but CT angiography is negative.  Slightly low potassium was repleted.  Troponins negative x2.  ECG otherwise unremarkable besides slightly increased rate.  At this point, she is having palpitations but no signs of an arrhythmia on cardiac monitoring.  Will refer to cardiology. Final Clinical Impression(s) / ED Diagnoses Final diagnoses:  Nonspecific chest pain  Palpitations    Rx / DC Orders ED Discharge Orders    None       Pricilla Loveless, MD 09/12/19 2333

## 2019-09-12 NOTE — ED Notes (Signed)
Pt ambulatory to restroom, tolerated well.  

## 2019-09-12 NOTE — ED Triage Notes (Signed)
Pt here via EMS for eval of onset of chest pain and palpitations while sitting on her couch this morning. PTAR reports HR of 150, but not during transport with [facility]. 324 ASA given by EMS. Pain decreased on arrival to ED. Seen for same and prescribed Lexapro recently.

## 2019-09-12 NOTE — ED Notes (Signed)
Patient transported to CT 

## 2019-09-12 NOTE — Discharge Instructions (Addendum)
If you develop recurrent, continued, or worsening chest pain, shortness of breath, fever, vomiting, abdominal or back pain, or any other new/concerning symptoms then return to the ER for evaluation.  

## 2019-10-22 ENCOUNTER — Encounter: Payer: Self-pay | Admitting: Cardiology

## 2019-10-22 ENCOUNTER — Other Ambulatory Visit: Payer: Self-pay

## 2019-10-22 ENCOUNTER — Encounter: Payer: Self-pay | Admitting: *Deleted

## 2019-10-22 ENCOUNTER — Ambulatory Visit (INDEPENDENT_AMBULATORY_CARE_PROVIDER_SITE_OTHER): Payer: 59 | Admitting: Cardiology

## 2019-10-22 VITALS — BP 122/70 | HR 91 | Temp 97.7°F | Ht 61.0 in | Wt 136.8 lb

## 2019-10-22 DIAGNOSIS — R0602 Shortness of breath: Secondary | ICD-10-CM

## 2019-10-22 DIAGNOSIS — R072 Precordial pain: Secondary | ICD-10-CM | POA: Diagnosis not present

## 2019-10-22 DIAGNOSIS — Z7189 Other specified counseling: Secondary | ICD-10-CM

## 2019-10-22 DIAGNOSIS — Z8249 Family history of ischemic heart disease and other diseases of the circulatory system: Secondary | ICD-10-CM | POA: Diagnosis not present

## 2019-10-22 DIAGNOSIS — R002 Palpitations: Secondary | ICD-10-CM | POA: Diagnosis not present

## 2019-10-22 DIAGNOSIS — Z01812 Encounter for preprocedural laboratory examination: Secondary | ICD-10-CM

## 2019-10-22 LAB — BASIC METABOLIC PANEL
BUN/Creatinine Ratio: 13 (ref 9–23)
BUN: 8 mg/dL (ref 6–20)
CO2: 21 mmol/L (ref 20–29)
Calcium: 9.7 mg/dL (ref 8.7–10.2)
Chloride: 102 mmol/L (ref 96–106)
Creatinine, Ser: 0.62 mg/dL (ref 0.57–1.00)
GFR calc Af Amer: 139 mL/min/{1.73_m2} (ref >59)
GFR calc non Af Amer: 121 mL/min/{1.73_m2} (ref >59)
Glucose: 77 mg/dL (ref 65–99)
Potassium: 4.5 mmol/L (ref 3.5–5.2)
Sodium: 139 mmol/L (ref 134–144)

## 2019-10-22 MED ORDER — METOPROLOL TARTRATE 50 MG PO TABS: ORAL_TABLET | ORAL | 0 refills | Status: AC

## 2019-10-22 NOTE — Progress Notes (Signed)
Cardiology Office Note:    Date:  10/22/2019   ID:  Jacqueline Bailey, DOB [birth year], MRN [id number]  PCP:  London Pepper, MD  Cardiologist:  Buford Dresser, MD  Referring MD: London Pepper, MD   CC: new patient consultation for chest pain, shortness of breath, and palpitations  History of Present Illness:    Jacqueline Bailey is a 34 y.o. female with no prior cardiac history who is seen as a new consult at the request of London Pepper, MD for the evaluation and management of chest pain, shortness of breath, and palpitations.  Note from Dr. Orland Mustard dated 8.17.21 reviewed. Reviewed recent ER visit. Referred for history of chest tightness, shortness of breath, and palpitations.   ER note dated 09/02/19 also reviewed. Presented with chest tightness, shortness of breath, tingling fingertip. Normal CXR, ECG. Unremarkable troponin. Trialed on protonix and hydroxyzine.  Also reviewed ER note of 09/12/19. Presented with chest pain. Started with palpitations/rapid heart beat while resting. Followed by chest tightness/pressure and burning in her chest with shortness of breath. Has sinus tachycardia. D dimer elevated but CT negative for PE. Troponins unremarkable.   Tachycardia/palpitations: -Initial onset: noted after first dose of Covid vaccine [employer]) on 08/21/19. Felt fine initially. About a week later, was watching TV, and her heart started racing. Couldn't catch her breath, having pressure/sharp pain in her chest. Left hand was tingling. Called 911. Waited for 16 hours and then left. Happened again the next day, went to urgent care. Then later, had another event. Called 911. EMS reported that HR was 158. Went to ER.  -Frequency/Duration: happening at least twice a week, sometimes more. Lasts anywhere from less than 5 minutes to as long as 30 minutes. -Associated symptoms: chest pain/pressure, shortness of breath. Feels fatigued after. Notices more when she is resting. -Aggravating/alleviating  factors: no associated factors that she knows of -Syncope/near syncope: none -Prior cardiac history: none -Prior workup: none -Prior treatment: tried PPI and hydroxyzine. -Possible medication interactions: none -Caffeine: no soda. Does drink sweet tea. -Alcohol: cut back on red wine, scared to even drink -Tobacco: former, none recent -OTC supplements: rare BC powder for a headache -Comorbidities: see PMH -Exercise level: not currently working, decreased household activity as she is nervous that this may trigger symptoms. -Labs: TSH 02/2018, kidney function/electrolytes 09/12/19, CBC 09/12/19 reviewed in [facility] -Cardiac ROS: no PND, no orthopnea, no LE edema. -Family history: Mother with MI, first around age 3, most recent in her 7s. Has HTN. Has kidney issues as well, has a small hole in her heart. No one else she knows of with cardiac issues. Many members on her father's side with history of stroke. Brother had brain aneurysm. Paternal cousin with stroke in her 56s.   Past Medical History:  Diagnosis Date  . Anxiety   . Gestational diabetes   . Menorrhagia    with iron deficiency anemia  . Psychogenic nonepileptic seizure    none recently  . PTSD (post-traumatic stress disorder)     Past Surgical History:  Procedure Laterality Date  . CESAREAN SECTION N/A 05/16/2012   Procedure: CESAREAN SECTION;  Surgeon: Claiborne Billings A. Pamala Hurry, MD;  Location: [facility];  Service: Obstetrics;  Laterality: N/A;    Current Medications: Current Outpatient Medications on File Prior to Visit  Medication Sig  . hydrOXYzine (ATARAX/VISTARIL) 25 MG tablet Take 1 tablet (25 mg total) by mouth every 6 (six) hours.  . pantoprazole (PROTONIX) 20 MG tablet Take 1 tablet (20 mg total) by mouth daily.  No current facility-administered medications on file prior to visit.     Allergies:   Patient has no known allergies.   Social History   Tobacco Use  . Smoking status: Former Smoker    Quit date: 05/15/2011     Years since quitting: 8.4  . Smokeless tobacco: Never Used  Substance Use Topics  . Alcohol use: No  . Drug use: No    Family History: family history includes Cancer in her mother; Hyperlipidemia in her father and mother; Hypertension in her father and mother.  ROS:   Please see the history of present illness.  Additional pertinent ROS: Constitutional: Negative for chills, fever, night sweats, unintentional weight loss  HENT: Negative for ear pain and hearing loss.   Eyes: Negative for loss of vision and eye pain.  Respiratory: Negative for cough, sputum, wheezing.   Cardiovascular: See HPI. Gastrointestinal: Negative for abdominal pain, melena, and hematochezia.  Genitourinary: Negative for dysuria and hematuria.  Musculoskeletal: Negative for falls and myalgias.  Skin: Negative for itching and rash.  Neurological: Negative for focal weakness, focal sensory changes and loss of consciousness.  Endo/Heme/Allergies: Does not bruise/bleed easily.     EKGs/Labs/Other Studies Reviewed:    The following studies were reviewed today: No prior cardiac studies  EKG:  EKG is personally reviewed.  The ekg ordered today demonstrates normal sinus rhythm with sinus arrhythmia at 91 bpm  Recent Labs: 09/12/2019: BUN 10; Creatinine, Ser 0.68; Hemoglobin 10.4; Platelets 415; Potassium 3.4; Sodium 138  Recent Lipid Panel No results found for: CHOL, TRIG, HDL, CHOLHDL, VLDL, LDLCALC, LDLDIRECT  Physical Exam:    VS:  BP 122/70   Pulse 91   Temp 97.7 F (36.5 C)   Ht 5' 1"  (1.549 m)   Wt 136 lb 12.8 oz (62.1 kg)   BMI 25.85 kg/m     Wt Readings from Last 3 Encounters:  10/22/19 136 lb 12.8 oz (62.1 kg)  09/12/19 139 lb (63 kg)  09/01/19 158 lb 11.7 oz (72 kg)    GEN: Well nourished, well developed in no acute distress HEENT: Normal, moist mucous membranes NECK: No JVD CARDIAC: regular rhythm, normal S1 and S2, no rubs or gallops. No murmurs. VASCULAR: Radial and DP pulses 2+  bilaterally. No carotid bruits RESPIRATORY:  Clear to auscultation without rales, wheezing or rhonchi  ABDOMEN: Soft, non-tender, non-distended MUSCULOSKELETAL:  Ambulates independently SKIN: Warm and dry, no edema NEUROLOGIC:  Alert and oriented x 3. No focal neuro deficits noted. PSYCHIATRIC:  Normal affect    ASSESSMENT:    1. Precordial pain   2. Heart palpitations   3. Shortness of breath   4. Family history of heart disease   5. Pre-procedure lab exam   6. Cardiac risk counseling   7. Counseling on health promotion and disease prevention    PLAN:    Chest pain Palpitations Shortness of breath -We spent significant time today reviewing different parts of the cardiovascular system (electrical, vascular, functional, and valvular). We discussed how each of these systems can present with different symptoms. We reviewed that there are different ways we evaluate these symptoms with tests. We reviewed which tests I think are most appropriate given the symptoms, and we discussed risks/benefits and limitations of each of these tests. Please see summary below. We also discussed that if testing is unrevealing for a cardiac cause of the symptoms, there are many noncardiac causes as well that can contribute to symptoms. If the heart is ruled out, then I recommend  returning to PCP to discuss alternative diagnoses. -will get 2 week Zio monitor to evaluate for arrhythmia -wil get echocardiogram to exclude structural/functional causes for symptoms --discussed treadmill stress, nuclear stress/lexiscan, and CT coronary angiography. Discussed pros and cons of each, including but not limited to false positive/false negative risk, radiation risk, and risk of IV contrast dye. Based on shared decision making, decision was made to pursue CT coronary angiography. -will give one time dose of metoprolol 2 hours prior to scheduled test -counseled on need to get BMET prior to test -counseled on use of sublingual  nitroglycerin and its importance to a good test  Cardiac risk counseling and prevention recommendations: Family history in her mother is main additional risk -recommend heart healthy/Mediterranean diet, with whole grains, fruits, vegetable, fish, lean meats, nuts, and olive oil. Limit salt. -recommend moderate walking, 3-5 times/week for 30-50 minutes each session. Aim for at least 150 minutes.week. Goal should be pace of 3 miles/hours, or walking 1.5 miles in 30 minutes -recommend avoidance of tobacco products. Avoid excess alcohol. -ASCVD risk score: The ASCVD Risk score Mikey Bussing DC Jr., et al., 2013) failed to calculate for the following reasons:   The 2013 ASCVD risk score is only valid for ages 47 to 80    Plan for follow up: if testing is all unremarkable, can follow up as needed. If there are significant abnormalities, will bring back to discuss next steps  Buford Dresser, MD, PhD [facility]    Medication Adjustments/Labs and Tests Ordered: Current medicines are reviewed at length with the patient today.  Concerns regarding medicines are outlined above.  Orders Placed This Encounter  Procedures  . CT CORONARY MORPH W/CTA COR W/SCORE W/CA W/CM &/OR WO/CM  . CT CORONARY FRACTIONAL FLOW RESERVE DATA PREP  . CT CORONARY FRACTIONAL FLOW RESERVE FLUID ANALYSIS  . Basic metabolic panel  . LONG TERM MONITOR (3-14 DAYS)  . EKG 12-Lead  . ECHOCARDIOGRAM COMPLETE   Meds ordered this encounter  Medications  . metoprolol tartrate (LOPRESSOR) 50 MG tablet    Sig: TAKE 1 TABLET 2 HR PRIOR TO CARDIAC PROCEDURE    Dispense:  1 tablet    Refill:  0    Patient Instructions  Medication Instructions:  Your Physician recommend you continue on your current medication as directed.    *If you need a refill on your cardiac medications before your next appointment, please call your pharmacy*   Lab Work: .Your physician recommends that you return for lab work today (  BMP).  If you have labs (blood work) drawn today and your tests are completely normal, you will receive your results only by: Marland Kitchen MyChart Message (if you have MyChart) OR . A paper copy in the mail If you have any lab test that is abnormal or we need to change your treatment, we will call you to review the results.   Testing/Procedures: Your physician has requested that you have an echocardiogram. Echocardiography is a painless test that uses sound waves to create images of your heart. It provides your doctor with information about the size and shape of your heart and how well your heart's chambers and valves are working. This procedure takes approximately one hour. There are no restrictions for this procedure. [address]  Our physician has recommended that you wear an 14  DAY ZIO-PATCH monitor. The Zio patch cardiac monitor continuously records heart rhythm data for up to 14 days, this is for patients being evaluated  for multiple types heart rhythms. For the first 24 hours post application, please avoid getting the Zio monitor wet in the shower or by excessive sweating during exercise. After that, feel free to carry on with regular activities. Keep soaps and lotions away from the ZIO XT Patch.   Someone from our office will call to verify address and mail monitor.   Cardiac CT Angiography (CTA), is a special type of CT scan that uses a computer to produce multi-dimensional views of major blood vessels throughout the body. In CT angiography, a contrast material is injected through an IV to help visualize the blood vessels [facility]   Follow-Up: At [facility], you and your health needs are our priority.  As part of our continuing mission to provide you with exceptional heart care, we have created designated Provider Care Teams.  These Care Teams include your primary Cardiologist (physician) and Advanced Practice Providers (APPs -  Physician Assistants and Nurse  Practitioners) who all work together to provide you with the care you need, when you need it.  We recommend signing up for the patient portal called "MyChart".  Sign up information is provided on this After Visit Summary.  MyChart is used to connect with patients for Virtual Visits (Telemedicine).  Patients are able to view lab/test results, encounter notes, upcoming appointments, etc.  Non-urgent messages can be sent to your provider as well.   To learn more about what you can do with MyChart, go to [email or web address].    Your next appointment:   Based on test results  The format for your next appointment:   In Person  Provider:   Buford Dresser, MD  Your cardiac CT will be scheduled at one of the below locations:   [facility] [address] [place], [place] [address] [phone]  If scheduled at [facility], please arrive at the [facility] main entrance of [facility] 30 minutes prior to test start time. Proceed to the [facility] Radiology Department (first floor) to check-in and test prep.  If scheduled at [facility], please arrive 15 mins early for check-in and test prep.  Please follow these instructions carefully (unless otherwise directed):   On the Night Before the Test: . Be sure to Drink plenty of water. . Do not consume any caffeinated/decaffeinated beverages or chocolate 12 hours prior to your test. . Do not take any antihistamines 12 hours prior to your test. .  On the Day of the Test: . Drink plenty of water. Do not drink any water within one hour of the test. . Do not eat any food 4 hours prior to the test. . You may take your regular medications prior to the test.  . Take metoprolol (Lopressor) 50 mg two hours prior to test. . FEMALES- please wear underwire-free bra if available         After the Test: . Drink plenty of water. . After receiving IV contrast, you may experience a mild  flushed feeling. This is normal. . On occasion, you may experience a mild rash up to 24 hours after the test. This is not dangerous. If this occurs, you can take Benadryl 25 mg and increase your fluid intake. . If you experience trouble breathing, this can be serious. If it is severe call 911 IMMEDIATELY. If it is mild, please call our office. . If you take any of these medications: Glipizide/Metformin, Avandament, Glucavance, please do not take 48 hours after completing  test unless otherwise instructed.   Once we have confirmed authorization from your insurance company, we will call you to set up a date and time for your test. Based on how quickly your insurance processes prior authorizations requests, please allow up to 4 weeks to be contacted for scheduling your Cardiac CT appointment. Be advised that routine Cardiac CT appointments could be scheduled as many as 8 weeks after your provider has ordered it.  For non-scheduling related questions, please contact the cardiac imaging nurse navigator should you have any questions/concerns: Marchia Bond, Cardiac Imaging Nurse Navigator Burley Saver, Interim Cardiac Imaging Nurse [place] [facility] Direct Office Dial: [phone]   For scheduling needs, including cancellations and rescheduling, please call Vivien Rota at [phone], option 3.   ZIO XT- Long Term Monitor Instructions   Your physician has requested you wear your ZIO patch monitor___14____days.   This is a single patch monitor.  [employer] supplies one patch monitor per enrollment.  Additional stickers are not available.   Please do not apply patch if you will be having a Nuclear Stress Test, Echocardiogram, Cardiac CT, MRI, or Chest Xray during the time frame you would be wearing the monitor. The patch cannot be worn during these tests.  You cannot remove and re-apply the ZIO XT patch monitor.   Your ZIO patch monitor will be sent USPS Priority mail from [facility] directly to your home address. The monitor may also be mailed to a PO BOX if home delivery is not available.   It may take 3-5 days to receive your monitor after you have been enrolled.   Once you have received you monitor, please review enclosed instructions.  Your monitor has already been registered assigning a specific monitor serial # to you.   Applying the monitor   Shave hair from upper left chest.   Hold abrader disc by orange tab.  Rub abrader in 40 strokes over left upper chest as indicated in your monitor instructions.   Clean area with 4 enclosed alcohol pads .  Use all pads to assure are is cleaned thoroughly.  Let dry.   Apply patch as indicated in monitor instructions.  Patch will be place under collarbone on left side of chest with arrow pointing upward.   Rub patch adhesive wings for 2 minutes.Remove white label marked "1".  Remove white label marked "2".  Rub patch adhesive wings for 2 additional minutes.   While looking in a mirror, press and release button in center of patch.  A small green light will flash 3-4 times .  This will be your only indicator the monitor has been turned on.     Do not shower for the first 24 hours.  You may shower after the first 24 hours.   Press button if you feel a symptom. You will hear a small click.  Record Date, Time and Symptom in the Patient Log Book.   When you are ready to remove patch, follow instructions on last 2 pages of Patient Log Book.  Stick patch monitor onto last page of Patient Log Book.   Place Patient Log Book in [place] box.  Use locking tab on box and tape box closed securely.  The [employer] and [employer] has [employer] on it.  Please place in mailbox as soon as possible.  Your physician should have your test results approximately 7 days after the monitor has been mailed back to [facility].   Call [facility]  at [phone] if you have questions regarding your ZIO XT patch monitor.   Call them immediately if you see an orange light blinking on your monitor.   If your monitor falls off in less than 4 days contact our Monitor department at [phone].  If your monitor becomes loose or falls off after 4 days call [employer] at [phone] for suggestions on securing your monitor.      Signed, Buford Dresser, MD PhD 10/22/2019 1:13 PM    [place]

## 2019-10-22 NOTE — Patient Instructions (Addendum)
Medication Instructions:  Your Physician recommend you continue on your current medication as directed.    *If you need a refill on your cardiac medications before your next appointment, please call your pharmacy*   Lab Work: .Your physician recommends that you return for lab work today ( BMP).  If you have labs (blood work) drawn today and your tests are completely normal, you will receive your results only by: Marland Kitchen MyChart Message (if you have MyChart) OR . A paper copy in the mail If you have any lab test that is abnormal or we need to change your treatment, we will call you to review the results.   Testing/Procedures: Your physician has requested that you have an echocardiogram. Echocardiography is a painless test that uses sound waves to create images of your heart. It provides your doctor with information about the size and shape of your heart and how well your heart's chambers and valves are working. This procedure takes approximately one hour. There are no restrictions for this procedure. [place] [address]  Our physician has recommended that you wear an 14  DAY ZIO-PATCH monitor. The Zio patch cardiac monitor continuously records heart rhythm data for up to 14 days, this is for patients being evaluated for multiple types heart rhythms. For the first 24 hours post application, please avoid getting the Zio monitor wet in the shower or by excessive sweating during exercise. After that, feel free to carry on with regular activities. Keep soaps and lotions away from the ZIO XT Patch.   Someone from our office will call to verify address and mail monitor.   Cardiac CT Angiography (CTA), is a special type of CT scan that uses a computer to produce multi-dimensional views of major blood vessels throughout the body. In CT angiography, a contrast material is injected through an IV to help visualize the blood vessels [facility]   Follow-Up: At [facility], you and your  health needs are our priority.  As part of our continuing mission to provide you with exceptional heart care, we have created designated Provider Care Teams.  These Care Teams include your primary Cardiologist (physician) and Advanced Practice Providers (APPs -  Physician Assistants and Nurse Practitioners) who all work together to provide you with the care you need, when you need it.  We recommend signing up for the patient portal called "MyChart".  Sign up information is provided on this After Visit Summary.  MyChart is used to connect with patients for Virtual Visits (Telemedicine).  Patients are able to view lab/test results, encounter notes, upcoming appointments, etc.  Non-urgent messages can be sent to your provider as well.   To learn more about what you can do with MyChart, go to [email or web address].    Your next appointment:   Based on test results  The format for your next appointment:   In Person  Provider:   Buford Dresser, MD  Your cardiac CT will be scheduled at one of the below locations:   [facility] [address] [place], [place] [address] [phone]  If scheduled at [facility], please arrive at the [facility] main entrance of [facility] 30 minutes prior to test start time. Proceed to the [facility] Radiology Department (first floor) to check-in and test prep.  If scheduled at [facility], please arrive 15 mins early for check-in and test prep.  Please follow these instructions carefully (unless otherwise directed):   On the  Night Before the Test: . Be sure to Drink plenty of water. . Do not consume any caffeinated/decaffeinated beverages or chocolate 12 hours prior to your test. . Do not take any antihistamines 12 hours prior to your test. .  On the Day of the Test: . Drink plenty of water. Do not drink any water within one hour of the test. . Do not eat any food 4 hours prior to the  test. . You may take your regular medications prior to the test.  . Take metoprolol (Lopressor) 50 mg two hours prior to test. . FEMALES- please wear underwire-free bra if available         After the Test: . Drink plenty of water. . After receiving IV contrast, you may experience a mild flushed feeling. This is normal. . On occasion, you may experience a mild rash up to 24 hours after the test. This is not dangerous. If this occurs, you can take Benadryl 25 mg and increase your fluid intake. . If you experience trouble breathing, this can be serious. If it is severe call 911 IMMEDIATELY. If it is mild, please call our office. . If you take any of these medications: Glipizide/Metformin, Avandament, Glucavance, please do not take 48 hours after completing test unless otherwise instructed.   Once we have confirmed authorization from your insurance company, we will call you to set up a date and time for your test. Based on how quickly your insurance processes prior authorizations requests, please allow up to 4 weeks to be contacted for scheduling your Cardiac CT appointment. Be advised that routine Cardiac CT appointments could be scheduled as many as 8 weeks after your provider has ordered it.  For non-scheduling related questions, please contact the cardiac imaging nurse navigator should you have any questions/concerns: Marchia Bond, Cardiac Imaging Nurse Navigator Burley Saver, Interim Cardiac Imaging Nurse [facility] Direct Office Dial: [phone]   For scheduling needs, including cancellations and rescheduling, please call Vivien Rota at [phone], option 3.   ZIO XT- Long Term Monitor Instructions   Your physician has requested you wear your ZIO patch monitor___14____days.   This is a single patch monitor.  [employer] supplies one patch monitor per enrollment.  Additional stickers are not available.   Please do not apply patch if you will be having a  Nuclear Stress Test, Echocardiogram, Cardiac CT, MRI, or Chest Xray during the time frame you would be wearing the monitor. The patch cannot be worn during these tests.  You cannot remove and re-apply the ZIO XT patch monitor.   Your ZIO patch monitor will be sent USPS Priority mail from [facility] directly to your home address. The monitor may also be mailed to a PO BOX if home delivery is not available.   It may take 3-5 days to receive your monitor after you have been enrolled.   Once you have received you monitor, please review enclosed instructions.  Your monitor has already been registered assigning a specific monitor serial # to you.   Applying the monitor   Shave hair from upper left chest.   Hold abrader disc by orange tab.  Rub abrader in 40 strokes over left upper chest as indicated in your monitor instructions.   Clean area with 4 enclosed alcohol pads .  Use all pads to assure are is cleaned thoroughly.  Let dry.   Apply patch as indicated in monitor instructions.  Patch will be place under collarbone on left side  of chest with arrow pointing upward.   Rub patch adhesive wings for 2 minutes.Remove white label marked "1".  Remove white label marked "2".  Rub patch adhesive wings for 2 additional minutes.   While looking in a mirror, press and release button in center of patch.  A small green light will flash 3-4 times .  This will be your only indicator the monitor has been turned on.     Do not shower for the first 24 hours.  You may shower after the first 24 hours.   Press button if you feel a symptom. You will hear a small click.  Record Date, Time and Symptom in the Patient Log Book.   When you are ready to remove patch, follow instructions on last 2 pages of Patient Log Book.  Stick patch monitor onto last page of Patient Log Book.   Place Patient Log Book in [place] box.  Use locking tab on box and tape box closed securely.  The [employer] has [employer] on it.  Please place in mailbox as soon as possible.  Your physician should have your test results approximately 7 days after the monitor has been mailed back to [facility].   Call [place] at [phone] if you have questions regarding your ZIO XT patch monitor.  Call them immediately if you see an orange light blinking on your monitor.   If your monitor falls off in less than 4 days contact our Monitor department at [phone].  If your monitor becomes loose or falls off after 4 days call [employer] at [phone] for suggestions on securing your monitor.

## 2019-10-22 NOTE — Progress Notes (Signed)
Patient ID: Jacqueline Bailey, female   DOB: 01-21-1986, 34 y.o.   MRN: [id number] Patient enrolled for [employer] to ship a 14 day ZIO XT long term holter monitor to her home.

## 2019-11-04 ENCOUNTER — Telehealth: Payer: Self-pay | Admitting: Cardiology

## 2019-11-04 ENCOUNTER — Telehealth (HOSPITAL_COMMUNITY): Payer: Self-pay | Admitting: Emergency Medicine

## 2019-11-04 NOTE — Telephone Encounter (Signed)
° ° ° °  Pt cancelled her CT, she said she is nervious with this test because she have to take a pill to slow her HR. She said if she can have the heart monitor and echo if that's enough for Dr. Cristal Deer to diagnose her.

## 2019-11-04 NOTE — Telephone Encounter (Signed)
Reaching out to patient to offer assistance regarding upcoming cardiac imaging study; pt verbalizes understanding of appt date/time, parking situation and where to check in, pre-test NPO status and medications ordered, and verified current allergies; name and call back number provided for further questions should they arise AmeLie Hollars RN Navigator Cardiac Imaging [facility] [phone] office [phone] cell   Pt verbalized understanding to take PO metoprolol 2 hr prior to scan. Damara Klunder 

## 2019-11-04 NOTE — Telephone Encounter (Signed)
Per pt was nervous re taking the beta blocker to slow HR down forCardiac CT so pt has cancelled. Informed pt this test would look at arteries to evaluate if any blockages and other testing  looks at heart structure (echo) and rhythm (monitor) Will forward to Dr Christopher./cy

## 2019-11-05 ENCOUNTER — Ambulatory Visit (HOSPITAL_COMMUNITY): Payer: 59

## 2019-11-05 NOTE — Telephone Encounter (Signed)
Pt returned call to [facility] number - [phone]

## 2019-11-05 NOTE — Telephone Encounter (Signed)
Pt updated and verbalized understanding.  

## 2019-11-05 NOTE — Telephone Encounter (Signed)
Thank you. The CT shows different parts of the heart than the monitor and echo do, but we can discuss more after we have the results of her test.

## 2019-11-05 NOTE — Telephone Encounter (Signed)
Left message to call back  

## 2019-11-08 ENCOUNTER — Ambulatory Visit (HOSPITAL_COMMUNITY): Payer: 59 | Attending: Cardiology

## 2019-11-08 ENCOUNTER — Other Ambulatory Visit: Payer: Self-pay

## 2019-11-08 DIAGNOSIS — R002 Palpitations: Secondary | ICD-10-CM

## 2019-11-08 DIAGNOSIS — R0602 Shortness of breath: Secondary | ICD-10-CM

## 2019-11-08 LAB — ECHOCARDIOGRAM COMPLETE
Area-P 1/2: 5.88 cm2
S' Lateral: 2.7 cm

## 2020-04-16 ENCOUNTER — Encounter: Payer: Self-pay | Admitting: *Deleted

## 2020-04-16 NOTE — Progress Notes (Signed)
Patient ID: Jacqueline Bailey, female   DOB: [birth year], 35 y.o.   MRN: [id number] ZIO patch monitor has not been returned to [employer] to be processed and enrollment has been changed to status Lost.  [employer] has attempted to contact patient three times to request return of monitor.  Order has been cancelled.

## 2020-10-11 IMAGING — CT CT ANGIO CHEST
2 of 6 series · 19 of 46 positions shown · IV contrast (omnipaque)
Comparison: Chest x-ray 09/12/2019

CLINICAL DATA: Chest pain elevated D-dimer

EXAM:
CT ANGIOGRAPHY CHEST WITH CONTRAST
TECHNIQUE: Multidetector CT imaging of the chest was performed using the
standard protocol during bolus administration of intravenous
contrast. Multiplanar CT image reconstructions and MIPs were
obtained to evaluate the vascular anatomy.
CONTRAST:  65mL OMNIPAQUE IOHEXOL 350 MG/ML SOLN

[Series 7: thins · axial · 0.58mm/px · z∈[+1126,+1364]mm · 16 of 262 slices shown]
[im 12/262  lung]
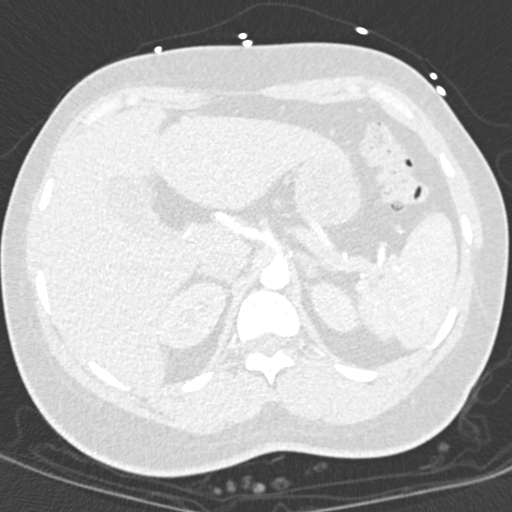
[im 35/262  soft-tissue]
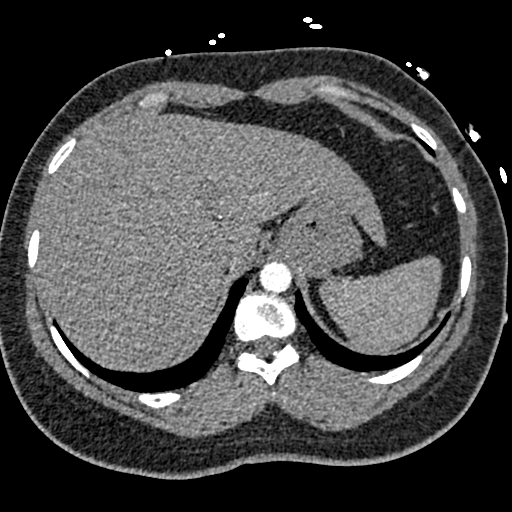
[im 46/262  lung]
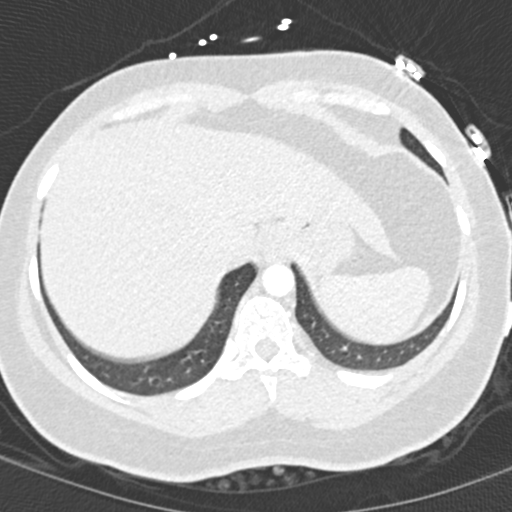
[im 57/262  soft-tissue]
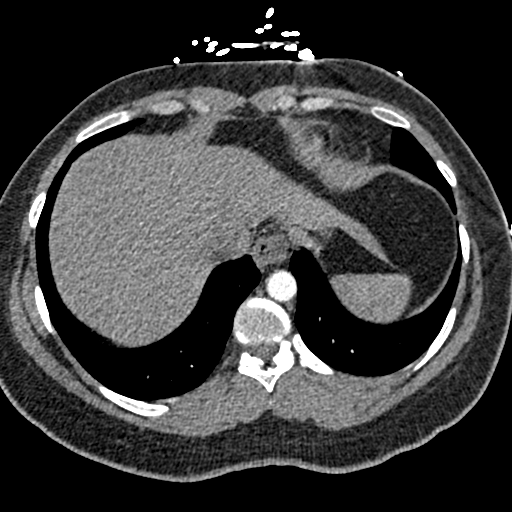
[im 80/262  lung]
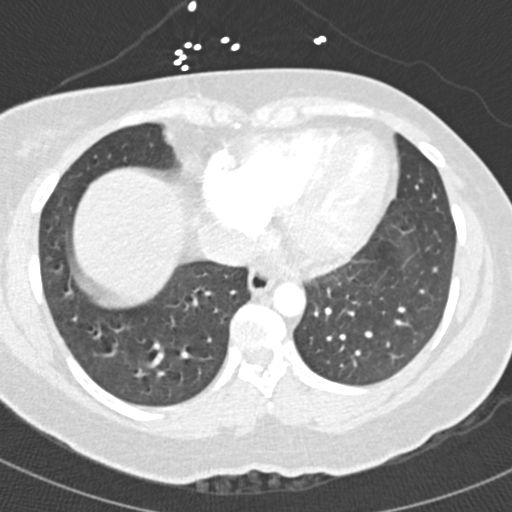
[im 91/262  soft-tissue]
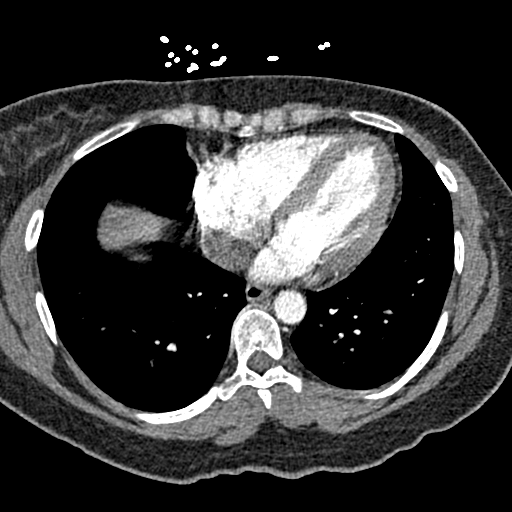
[im 103/262  lung]
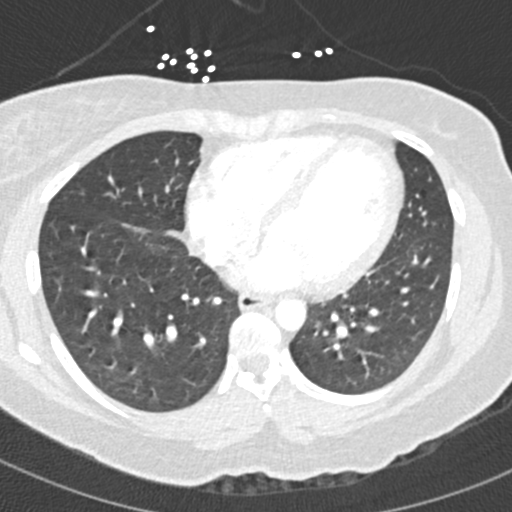
[im 125/262  soft-tissue]
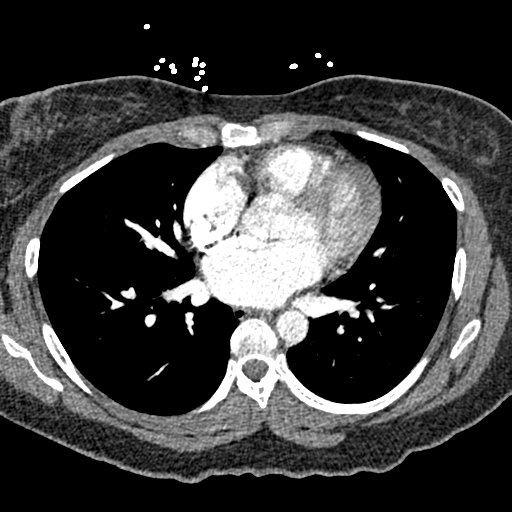
[im 137/262  lung]
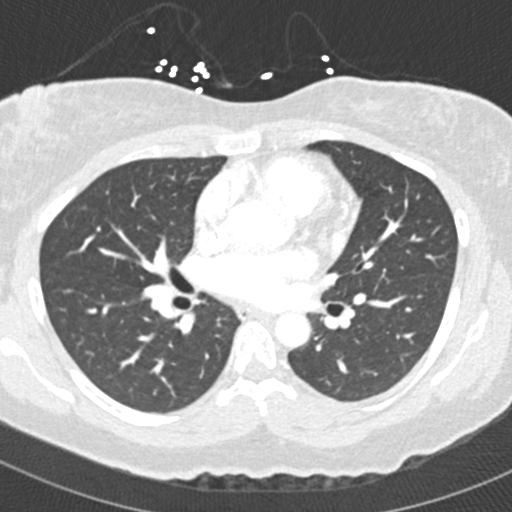
[im 159/262  soft-tissue]
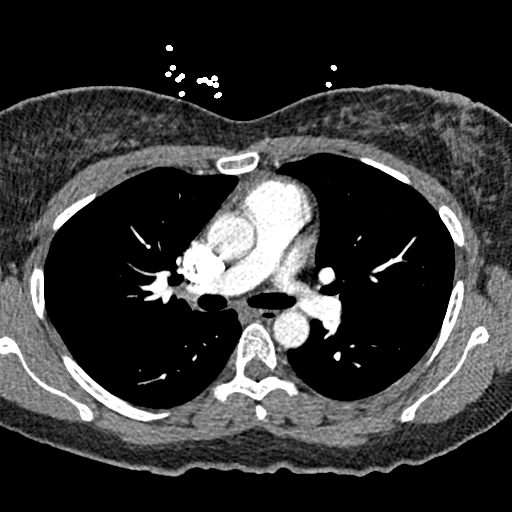
[im 171/262  lung]
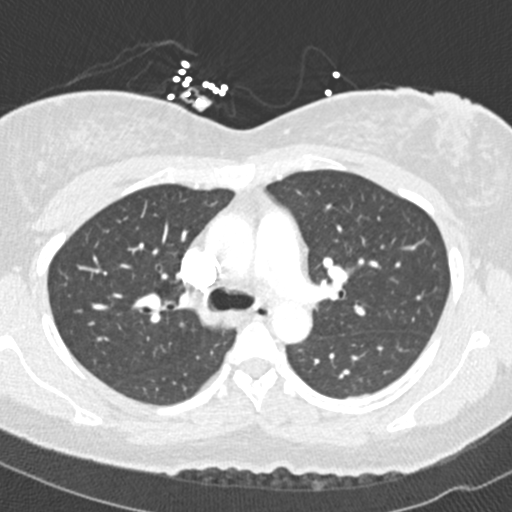
[im 182/262  soft-tissue]
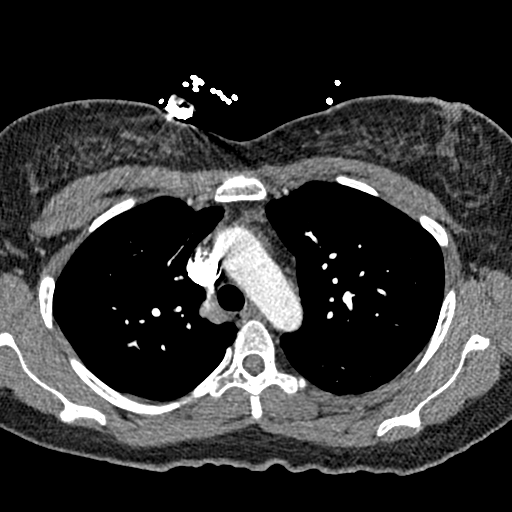
[im 205/262  lung]
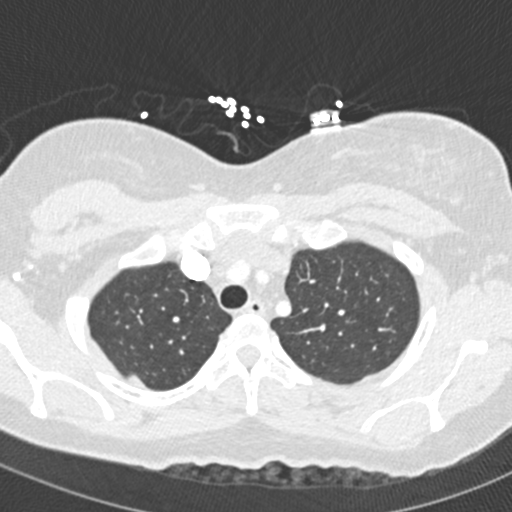
[im 216/262  soft-tissue]
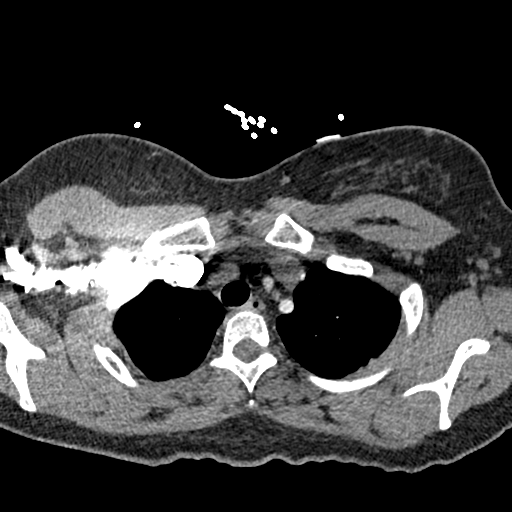
[im 227/262  lung]
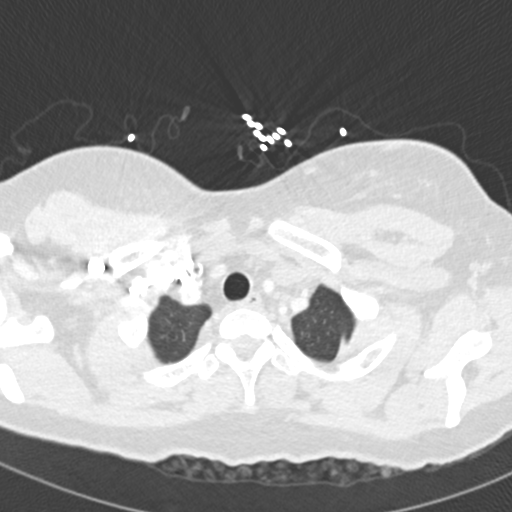
[im 250/262  soft-tissue]
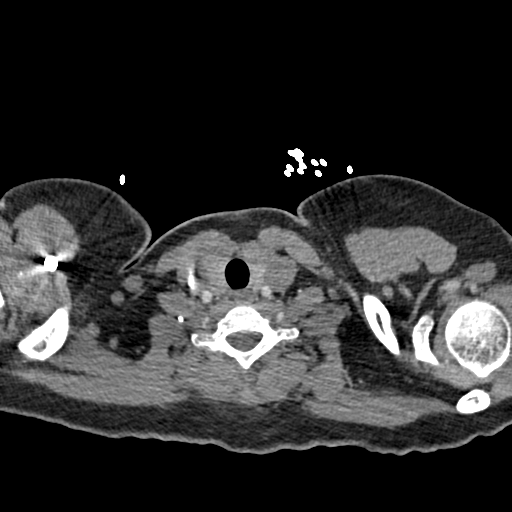

[Series 8: coronal mpr · coronal · 0.53mm/px · 3 of 117 slices shown]
[im 30/117  soft-tissue]
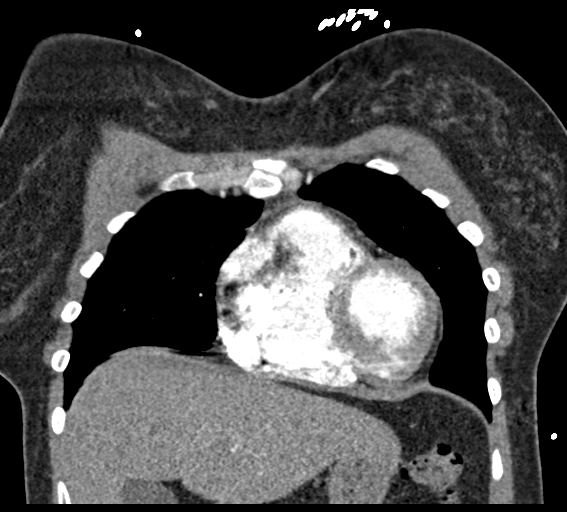
[im 59/117  soft-tissue]
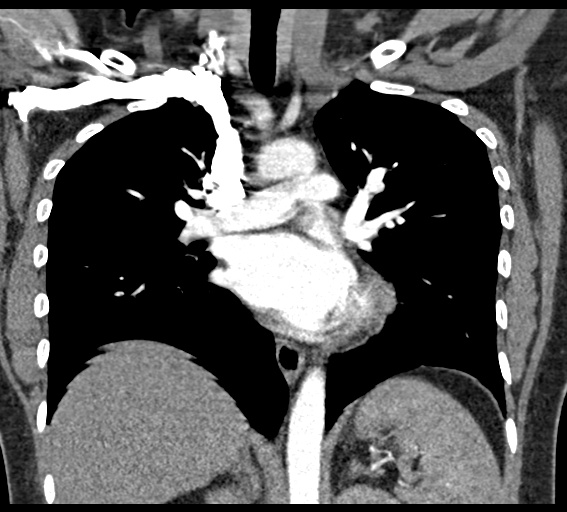
[im 88/117  soft-tissue]
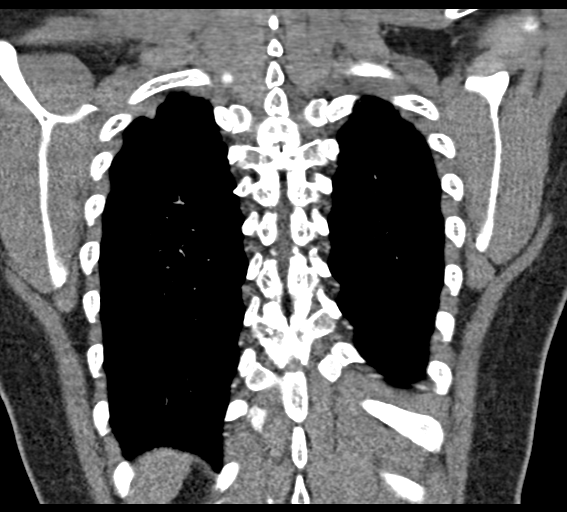

[19 of 46 positions shown; findings below may reference images not displayed]

FINDINGS: Cardiovascular: Satisfactory opacification of the pulmonary arteries
to the segmental level. No evidence of pulmonary embolism. Normal
heart size. No pericardial effusion. Nonaneurysmal aorta. No
dissection is seen.

Mediastinum/Nodes: No enlarged mediastinal, hilar, or axillary lymph
nodes. Thyroid gland, trachea, and esophagus demonstrate no
significant findings.

Lungs/Pleura: Lungs are clear. No pleural effusion or pneumothorax.

Upper Abdomen: No acute abnormality.

Musculoskeletal: No chest wall abnormality. No acute or significant
osseous findings.

Review of the MIP images confirms the above findings.
IMPRESSION: Negative. No CT evidence for acute pulmonary embolus or aortic
dissection. Clear lung fields.
